# Patient Record
Sex: Female | Born: 1970 | Race: White | Hispanic: No | Marital: Married | Smoking: Current every day smoker
Health system: Southern US, Community
[De-identification: ages and names within clinical notes are randomized; demographics above are authoritative.]

## PROBLEM LIST (undated history)

## (undated) DIAGNOSIS — F419 Anxiety disorder, unspecified: Secondary | ICD-10-CM

## (undated) DIAGNOSIS — M549 Dorsalgia, unspecified: Secondary | ICD-10-CM

## (undated) DIAGNOSIS — E119 Type 2 diabetes mellitus without complications: Secondary | ICD-10-CM

## (undated) DIAGNOSIS — M1990 Unspecified osteoarthritis, unspecified site: Secondary | ICD-10-CM

---

## 1998-06-02 ENCOUNTER — Other Ambulatory Visit: Admission: RE | Admit: 1998-06-02 | Discharge: 1998-06-02 | Payer: Self-pay | Admitting: *Deleted

## 1998-07-08 ENCOUNTER — Emergency Department (HOSPITAL_COMMUNITY): Admission: EM | Admit: 1998-07-08 | Discharge: 1998-07-08 | Payer: Self-pay | Admitting: Emergency Medicine

## 2011-04-24 ENCOUNTER — Ambulatory Visit (HOSPITAL_COMMUNITY)
Admission: RE | Admit: 2011-04-24 | Discharge: 2011-04-24 | Disposition: A | Discharge status: Home / Self Care | Payer: Medicaid Other | Source: Ambulatory Visit | Attending: Oral Surgery | Admitting: Oral Surgery

## 2011-04-24 DIAGNOSIS — E119 Type 2 diabetes mellitus without complications: Secondary | ICD-10-CM | POA: Insufficient documentation

## 2011-04-24 DIAGNOSIS — K029 Dental caries, unspecified: Secondary | ICD-10-CM | POA: Insufficient documentation

## 2011-04-24 DIAGNOSIS — Z86011 Personal history of benign neoplasm of the brain: Secondary | ICD-10-CM | POA: Insufficient documentation

## 2011-04-24 LAB — BASIC METABOLIC PANEL
CO2: 28 mEq/L (ref 19–32)
Calcium: 9.3 mg/dL (ref 8.4–10.5)
Chloride: 100 mEq/L (ref 96–112)
Creatinine, Ser: 0.85 mg/dL (ref 0.4–1.2)
GFR calc Af Amer: >60 mL/min (ref >60)
Sodium: 136 mEq/L (ref 135–145)

## 2011-04-24 LAB — CBC
Hemoglobin: 12.8 g/dL (ref 12.0–15.0)
MCH: 29.4 pg (ref 26.0–34.0)
MCHC: 32.9 g/dL (ref 30.0–36.0)
Platelets: 320 10*3/uL (ref 150–400)
RBC: 4.36 MIL/uL (ref 3.87–5.11)

## 2011-04-24 LAB — GLUCOSE, CAPILLARY
Glucose-Capillary: 111 mg/dL — ABNORMAL HIGH (ref 70–99)
Glucose-Capillary: 164 mg/dL — ABNORMAL HIGH (ref 70–99)

## 2011-04-24 LAB — HCG, SERUM, QUALITATIVE: Preg, Serum: NEGATIVE

## 2011-04-30 NOTE — Op Note (Signed)
Carrie Carlson, Carrie Carlson               ACCOUNT NO.:  [id number]  MEDICAL RECORD NO.:  [id number]           PATIENT TYPE:  O  LOCATION:  SDSC                         FACILITY:  [facility]  PHYSICIAN:  Georgia Lopes, M.D.  DATE OF BIRTH:  [birth year]  DATE OF PROCEDURE:  04/24/2011 DATE OF DISCHARGE:  04/24/2011                              OPERATIVE REPORT   POSTOPERATIVE DIAGNOSIS:  Nonrestorable teeth numbers 4, 5, 6, 9, 10, 11, 12, 13, 14, 15, 18, 19, 20, 28, 29, 30, and 31.  PROCEDURE:  Removal of 4, 5, 6, 9, 10, 11, 12, 13, 14, 15, 18, 19, 20, 28, 29, 30, and 31, alveoplasty maxilla-mandible.  SURGEON:  Georgia Lopes, MD  ANESTHESIA:  General, oral, Dr. Jacklynn Bue attending.  ASSISTANTS: 1. Luberta Mutter, DOMA 2. Marlana Latus, DOMA  INDICATIONS FOR PROCEDURE:  Carrie Carlson is a 40 year old female who was referred to my office by her general dentist for removal of all remaining teeth.  She has past medical history of diabetes, history of narcotic drug abuse, and history of pituitary of brain tumor.  Because of the extensiveness of the procedure and medical comorbidities. it was elected to perform this surgery with the patient intubated for airway safety in the hospital setting as an outpatient.  PROCEDURE:  The patient was taken to the operating room and placed on the table in supine position.  General anesthesia was administered. Attempts were made x2 at nasal intubation and were aborted after not being able to intubate by the CRNA and oral tube was then placed atraumatically and the patient was then draped for the procedure after the eyes were protected.  The posterior pharynx was suctioned.  A throat pack was placed.  lidocaine 2% with 1:100,000 epinephrine was infiltrated in an inferior alveolar block on the right and left side and palatal buccal and palatal infiltration, a total of 12 mL was utilized. A #15 blade was used to make a full-thickness incision around teeth numbers  18, 19, 20 and numbers 19, 11, 12, 13, 14, and 15.  The periosteum was reflected around these teeth.  Interproximal bone was removed with a Stryker handpiece, crosscut fissure bur under irrigation. Teeth were elevated with a 301 elevator.  The lower teeth were removed with the cowhorn forceps and the Asch forceps and the maxillary teeth were removed with the upper universal #150 forceps.  Teeth numbers 18 and 19 fractured and required further sectioning and removal of the roots with 301 elevator and rongeurs and the maxilla teeth numbers 12, 14, and 15 required further sectioning for removal of root tips.  The periosteum was then further reflected in the maxilla and mandible and alveoplasty was performed with the egg-shaped bur and further smoothed with a bone file.  Then, the areas were irrigated and closed with 3-0 chromic.  Attention was turned to the right side of the mouth.  A #15 blade was used to make a full-thickness incision around teeth numbers 4, 5 6, 28, 29, 30, and 31.  The periosteum was reflected.  Bone was removed proximally with a Stryker handpiece and then the teeth were elevated  with a 301 elevator.  The upper teeth were removed with the #150 forceps and the lower teeth were removed with the cowhorn and Ash forceps and the sockets were curetted.  The periosteum was further reflected and alveoplasty was performed with the egg-shaped bur and the bone file.  Then, the areas were irrigated and closed with 3-0 chromic. The oral cavity was inspected and found to have good continuity and hemostasis.  The posterior pharynx was suctioned.  Throat pack was removed.  The patient was awakened and taken to the recovery room breathing spontaneously in good condition.  ESTIMATED BLOOD LOSS:  Minimum.  COMPLICATIONS:  None.  SPECIMENS:  None.     Georgia Lopes, M.D.     SMJ/MEDQ  D:  04/24/2011  T:  04/25/2011  Job:  [id number]  Electronically Signed by Ocie Doyne M.D.  on 04/30/2011 10:24:25 AM

## 2015-04-25 ENCOUNTER — Emergency Department (HOSPITAL_COMMUNITY): Payer: Medicaid Other

## 2015-04-25 ENCOUNTER — Emergency Department (HOSPITAL_COMMUNITY)
Admission: EM | Admit: 2015-04-25 | Discharge: 2015-04-27 | Disposition: A | Discharge status: Home / Self Care | Payer: Medicaid Other | Attending: Emergency Medicine | Admitting: Emergency Medicine

## 2015-04-25 ENCOUNTER — Encounter (HOSPITAL_COMMUNITY): Payer: Self-pay | Admitting: Emergency Medicine

## 2015-04-25 DIAGNOSIS — Z23 Encounter for immunization: Secondary | ICD-10-CM | POA: Diagnosis not present

## 2015-04-25 DIAGNOSIS — Y998 Other external cause status: Secondary | ICD-10-CM | POA: Insufficient documentation

## 2015-04-25 DIAGNOSIS — Z8739 Personal history of other diseases of the musculoskeletal system and connective tissue: Secondary | ICD-10-CM | POA: Diagnosis not present

## 2015-04-25 DIAGNOSIS — F1110 Opioid abuse, uncomplicated: Secondary | ICD-10-CM | POA: Insufficient documentation

## 2015-04-25 DIAGNOSIS — R059 Cough, unspecified: Secondary | ICD-10-CM

## 2015-04-25 DIAGNOSIS — R05 Cough: Secondary | ICD-10-CM | POA: Diagnosis not present

## 2015-04-25 DIAGNOSIS — Y9389 Activity, other specified: Secondary | ICD-10-CM | POA: Diagnosis not present

## 2015-04-25 DIAGNOSIS — X780XXA Intentional self-harm by sharp glass, initial encounter: Secondary | ICD-10-CM | POA: Insufficient documentation

## 2015-04-25 DIAGNOSIS — F1310 Sedative, hypnotic or anxiolytic abuse, uncomplicated: Secondary | ICD-10-CM | POA: Diagnosis not present

## 2015-04-25 DIAGNOSIS — Z720 Tobacco use: Secondary | ICD-10-CM | POA: Diagnosis not present

## 2015-04-25 DIAGNOSIS — S61511A Laceration without foreign body of right wrist, initial encounter: Secondary | ICD-10-CM | POA: Diagnosis not present

## 2015-04-25 DIAGNOSIS — F250 Schizoaffective disorder, bipolar type: Secondary | ICD-10-CM | POA: Diagnosis present

## 2015-04-25 DIAGNOSIS — Y9289 Other specified places as the place of occurrence of the external cause: Secondary | ICD-10-CM | POA: Insufficient documentation

## 2015-04-25 DIAGNOSIS — F4324 Adjustment disorder with disturbance of conduct: Secondary | ICD-10-CM | POA: Diagnosis present

## 2015-04-25 DIAGNOSIS — Z046 Encounter for general psychiatric examination, requested by authority: Secondary | ICD-10-CM | POA: Diagnosis present

## 2015-04-25 HISTORY — DX: Anxiety disorder, unspecified: F41.9

## 2015-04-25 HISTORY — DX: Unspecified osteoarthritis, unspecified site: M19.90

## 2015-04-25 HISTORY — DX: Dorsalgia, unspecified: M54.9

## 2015-04-25 LAB — CBC
HCT: 44.5 % (ref 36.0–46.0)
HEMOGLOBIN: 15 g/dL (ref 12.0–15.0)
MCH: 29.5 pg (ref 26.0–34.0)
MCHC: 33.7 g/dL (ref 30.0–36.0)
MCV: 87.6 fL (ref 78.0–100.0)
PLATELETS: 372 10*3/uL (ref 150–400)
RBC: 5.08 MIL/uL (ref 3.87–5.11)
RDW: 14.5 % (ref 11.5–15.5)
WBC: 17.2 10*3/uL — AB (ref 4.0–10.5)

## 2015-04-25 LAB — RAPID URINE DRUG SCREEN, HOSP PERFORMED
Amphetamines: NOT DETECTED
BARBITURATES: NOT DETECTED
Benzodiazepines: POSITIVE — AB
COCAINE: NOT DETECTED
Opiates: POSITIVE — AB
TETRAHYDROCANNABINOL: NOT DETECTED

## 2015-04-25 LAB — COMPREHENSIVE METABOLIC PANEL
ALBUMIN: 4.4 g/dL (ref 3.5–5.2)
ALK PHOS: 106 U/L (ref 39–117)
ALT: 41 U/L — ABNORMAL HIGH (ref 0–35)
ANION GAP: 10 (ref 5–15)
AST: 38 U/L — AB (ref 0–37)
BILIRUBIN TOTAL: 0.4 mg/dL (ref 0.3–1.2)
BUN: 10 mg/dL (ref 6–23)
CO2: 26 mmol/L (ref 19–32)
CREATININE: 0.68 mg/dL (ref 0.50–1.10)
Calcium: 9.4 mg/dL (ref 8.4–10.5)
Chloride: 101 mmol/L (ref 96–112)
Glucose, Bld: 246 mg/dL — ABNORMAL HIGH (ref 70–99)
Potassium: 4 mmol/L (ref 3.5–5.1)
Sodium: 137 mmol/L (ref 135–145)
TOTAL PROTEIN: 8 g/dL (ref 6.0–8.3)

## 2015-04-25 LAB — SALICYLATE LEVEL: Salicylate Lvl: <4 mg/dL (ref 2.8–20.0)

## 2015-04-25 LAB — ACETAMINOPHEN LEVEL: Acetaminophen (Tylenol), Serum: <10 ug/mL — ABNORMAL LOW (ref 10–30)

## 2015-04-25 LAB — ETHANOL: Alcohol, Ethyl (B): <5 mg/dL (ref 0–9)

## 2015-04-25 MED ORDER — HYDROCODONE-ACETAMINOPHEN 5-325 MG PO TABS
2.0000 | ORAL_TABLET | Freq: Once | ORAL | Status: DC
  Filled 2015-04-25: qty 2

## 2015-04-25 MED ORDER — LORAZEPAM 1 MG PO TABS
1.0000 mg | ORAL_TABLET | Freq: Once | ORAL | Status: DC
  Filled 2015-04-25: qty 1

## 2015-04-25 MED ORDER — TETANUS-DIPHTH-ACELL PERTUSSIS 5-2.5-18.5 LF-MCG/0.5 IM SUSP
0.5000 mL | Freq: Once | INTRAMUSCULAR | Status: AC
  Administered 2015-04-25: 0.5 mL via INTRAMUSCULAR
  Filled 2015-04-25: qty 0.5

## 2015-04-25 NOTE — ED Notes (Signed)
Per pt, lives [facility]-states they took her air conditioner because they said she was using too much electricity-states she cut right wrist on air conditioner-states she was sleeping and next thing she knows they have taken papers out on her stating she cut her wrist on purpose

## 2015-04-25 NOTE — ED Notes (Signed)
Bed: [facility] Expected date:  Expected time:  Means of arrival:  Comments: No bed  

## 2015-04-25 NOTE — BH Assessment (Addendum)
Tele Assessment Note   Carrie Carlson is an 44 y.o. female.  -Clinician spoke to [facility], the PA about need for TTS.  Pt has IVC papers taken out by the facility director at [facility].  Patient said that she had gotten upset with the maintenance people at [facility].  Her husband had put in a small air conditioner unit in her window the week before last.  The maintenance people removed it because they said it was causing the breaker to be tripped.  She got upset with him and called them a name or two.  Patient got mad and hit her hand on the window and broke the glass and cut her right wrist.  The police were called and patient was given the opportunity to have EMS come out and she asked for EMS.  The paramedics looked at her hand and offered to take her to the ED which she turned down.  Patient took an ativan to help calm her around 16:00.  She went to sleep.  Around 17:50 she was awoken by the police who were there to serve papers.  Patient currently denies any SI, HI or A/V hallucinations.  Patient says that she is seen by the psychiatrist that does rounds there.  She did not know the person's name.  Patient admits to [name] in [facility] past.  [facility] has [facility] for about a 6 month period.  Pt says that she can perform her ADLs but that she needs help with reminders about cleaning.  Clinician called [facility] to talk with Carrie Carlson (facility director) who took out the IVC papers.  Clinician spoke to [place] (2nd shift) who had heard 2nd hand accounts of what happened.  She said that the director would be in during the day tomorrow (04/26) and could provide more information on what happened.  Carrie PacificKenisha said that their psychiatrist is Dr. Cherene Julianornelia Carlson.  -Clinician did talk to [name], PA regarding disposition.  He said that if patient is unable to perform her ADLs completely then she would not be able [facility].  He said that an AM  psychiatric evaluation on 04/26 to uphold or rescind IVC is needed.  Clinician informed Trisha MangleKaren Sophia about the disposition.   Axis I: Post Traumatic Stress Disorder Axis II: Deferred Axis III:  Past Medical History  Diagnosis Date  . Osteoarthritis   . Back pain   . Anxiety    Axis IV: other psychosocial or environmental problems and problems with primary support group Axis V: 41-50 serious symptoms  Past Medical History:  Past Medical History  Diagnosis Date  . Osteoarthritis   . Back pain   . Anxiety     History reviewed. No pertinent past surgical history.  Family History: No family history on file.  Social History:  reports that she has been smoking.  She does not have any smokeless tobacco history on file. She reports that she does not drink alcohol or use illicit drugs.  Additional Social History:  Alcohol / Drug Use Pain Medications: See PTA medication list Prescriptions: See PTA medication list Over the Counter: See PTA medication list History of alcohol / drug use?: No history of alcohol / drug abuse  CIWA: CIWA-Ar BP: 109/69 mmHg Pulse Rate: 100 COWS:    PATIENT STRENGTHS: (choose at least two) Average or above average intelligence Communication skills  Allergies: No Known Allergies  Home Medications:  (Not in a  hospital admission)  OB/GYN Status:  No LMP recorded. Patient is postmenopausal.  General Assessment Data Location of Assessment: [facility] ED Is this a Tele or Face-to-Face Assessment?: Face-to-Face Is this an Initial Assessment or a Re-assessment for this encounter?: Initial Assessment Living Arrangements: Other (Comment) ([facility] for the last 6 months.) Can pt return to current living arrangement?: Yes Admission Status: Involuntary Is patient capable of signing voluntary admission?: No Transfer from: Acute Hospital Referral Source: Other ([facility] staff)     [facility] Crisis Care Plan Living Arrangements: Other (Comment) ([facility] for the  last 6 months.) Name of Psychiatrist: Psychiatrist w/ [facility] Name of Therapist: None  Education Status Highest grade of school patient has completed: 10th grade  Risk to self with the past 6 months Suicidal Ideation: No Suicidal Intent: No Is patient at risk for suicide?: No Suicidal Plan?: No Access to Means: No What has been your use of drugs/alcohol within the last 12 months?: None Previous Attempts/Gestures: No How many times?: 0 Other Self Harm Risks: none Triggers for Past Attempts: None known Intentional Self Injurious Behavior: None Family Suicide History: Yes (An uncle committed suicide.) Recent stressful life event(s): Conflict (Comment) Research scientist (medical) removed from room at [name] today) Persecutory voices/beliefs?: No Depression: Yes Depression Symptoms: Despondent, Isolating Substance abuse history and/or treatment for substance abuse?: No Suicide prevention information given to non-admitted patients: Not applicable  Risk to Others within the past 6 months Homicidal Ideation: No Thoughts of Harm to Others: No Current Homicidal Intent: No Current Homicidal Plan: No Access to Homicidal Means: No Identified Victim: No one History of harm to others?: No Assessment of Violence: None Noted Violent Behavior Description: None reported. Does patient have access to weapons?: No Criminal Charges Pending?: No Does patient have a court date: No  Psychosis Hallucinations: None noted Delusions: None noted  Mental Status Report Appearance/Hygiene: Disheveled, In scrubs Eye Contact: Poor Motor Activity: Freedom of movement, Unremarkable Speech: Logical/coherent Level of Consciousness: Quiet/awake Mood: Depressed, Sad Affect: Apprehensive, Depressed Anxiety Level: Severe Thought Processes: Coherent, Relevant Judgement: Unimpaired Orientation: Person, Place, Time, Situation Obsessive Compulsive Thoughts/Behaviors: None  Cognitive  Functioning Concentration: Decreased Memory: Remote Intact, Recent Intact IQ: Average Insight: Fair Impulse Control: Poor Appetite: Good Weight Loss: 0 Weight Gain: 0 Sleep: Decreased Total Hours of Sleep:  (<6H/D) Vegetative Symptoms: None  ADLScreening [facility] Assessment Services) Patient's cognitive ability adequate to safely complete daily activities?: Yes Patient able to express need for assistance with ADLs?: Yes Independently performs ADLs?: Yes (appropriate for developmental age) (Needs help with some housekeeping items.)  Prior Inpatient Therapy Prior Inpatient Therapy: Yes Prior Therapy Dates: "I don't remember." Prior Therapy Facilty/Provider([facility]): [facility]  Reason for Treatment: stabilization  Prior Outpatient Therapy Prior Outpatient Therapy: Yes Prior Therapy Dates: Last 6 months Prior Therapy Facilty/Provider(s): Psychiatrist on contract w/ [facility] Reason for Treatment: med monitoring  ADL Screening (condition at time of admission) Patient's cognitive ability adequate to safely complete daily activities?: Yes Is the patient deaf or have difficulty hearing?: No Does the patient have difficulty seeing, even when wearing glasses/contacts?: No Does the patient have difficulty concentrating, remembering, or making decisions?: Yes Patient able to express need for assistance with ADLs?: Yes Does the patient have difficulty dressing or bathing?: No Independently performs ADLs?: Yes (appropriate for developmental age) (Needs help with some housekeeping items.) Does the patient have difficulty walking or climbing stairs?: Yes (Takes one step at a time and uses rails.) Weakness of Legs: Both (Both knees are  weak.) Weakness of Arms/Hands: None  Home Assistive Devices/Equipment Home Assistive Devices/Equipment: None    Abuse/Neglect Assessment (Assessment to be complete while patient is alone) Physical Abuse: Denies Verbal Abuse: Denies Sexual Abuse:  Denies Exploitation of patient/patient's resources: Denies Self-Neglect: Denies     Merchant navy officer (For Healthcare) Does patient have an advance directive?: No Would patient like information on creating an advanced directive?: No - patient declined information    Additional Information 1:1 In Past 12 Months?: No CIRT Risk: No Elopement Risk: No Does patient have medical clearance?: Yes     Disposition:  Disposition Initial Assessment Completed for this Encounter: Yes Disposition of Patient: Other dispositions Other disposition(s): To current provider (To be reviewed with PA)  Beatriz Stallion Ray 04/25/2015 9:49 PM

## 2015-04-25 NOTE — ED Provider Notes (Signed)
CSN: [id number]     [id number] date & time 04/25/15  1813 History   First MD Initiated Contact with Patient 04/25/15 1920     Chief Complaint  Patient presents with  . IVC      (Consider location/radiation/quality/duration/timing/severity/associated sxs/prior Treatment) Patient is a 44 y.o. female presenting with mental health disorder. The history is provided by the patient. No language interpreter was used.  Mental Health Problem Presenting symptoms: aggressive behavior and self mutilation   Presenting symptoms: no disorganized speech and no disorganized thought process   Patient accompanied by:  Law enforcement Degree of incapacity (severity):  Moderate Onset quality:  Sudden Duration:  1 day Progression:  Worsening Context: recent medication change   Treatment compliance:  All of the time Relieved by:  Anti-anxiety medications Worsened by:  Nothing tried Ineffective treatments:  None tried Associated symptoms: poor judgment   Pt lives in an assisted living.  Pt reports they took out her air condition and she became mad and hit the glass window and broke it.   Pt has a cut on her wrist.   The facility did IVC papers due to increasing aggression.  [facility] because she can not care for herself.   Past Medical History  Diagnosis Date  . Osteoarthritis   . Back pain   . Anxiety    History reviewed. No pertinent past surgical history. No family history on file. History  Substance Use Topics  . Smoking status: Current Every Day Smoker  . Smokeless tobacco: Not on file  . Alcohol Use: No   OB History    No data available     Review of Systems  Psychiatric/Behavioral: Positive for self-injury.  All other systems reviewed and are negative.     Allergies  Review of patient's allergies indicates not on file.  Home Medications   Prior to Admission medications   Not on File   BP 163/115 mmHg  Pulse 141  Temp(Src) 98.4 F (36.9 C)  (Oral)  Resp 18  SpO2 94% Physical Exam  Constitutional: She is oriented to person, place, and time. She appears well-developed and well-nourished.  HENT:  Head: Normocephalic and atraumatic.  Right Ear: External ear normal.  Left Ear: External ear normal.  Mouth/Throat: Oropharynx is clear and moist.  Eyes: Conjunctivae and EOM are normal. Pupils are equal, round, and reactive to light.  Neck: Normal range of motion. Neck supple.  Cardiovascular: Normal rate and normal heart sounds.   Pulmonary/Chest: Effort normal and breath sounds normal.  Abdominal: Soft.  Musculoskeletal:  1.4 cm laceration right wrist.   No gapping,  From,  nv and ns intact  Neurological: She is alert and oriented to person, place, and time. She has normal reflexes.  Skin: Skin is warm.  Psychiatric: She has a normal mood and affect.  Nursing note and vitals reviewed.   ED Course  Procedures (including critical care time) Labs Review Labs Reviewed  ACETAMINOPHEN LEVEL  CBC  COMPREHENSIVE METABOLIC PANEL  ETHANOL  SALICYLATE LEVEL  URINE RAPID DRUG SCREEN (HOSP PERFORMED)   Results for orders placed or performed during the hospital encounter of 04/25/15  Acetaminophen level  Result Value Ref Range   Acetaminophen (Tylenol), Serum <10.0 (L) 10 - 30 ug/mL  CBC  Result Value Ref Range   WBC 17.2 (H) 4.0 - 10.5 K/uL   RBC 5.08 3.87 - 5.11 MIL/uL   Hemoglobin 15.0 12.0 - 15.0 g/dL   HCT 40.944.5 81.136.0 -  46.0 %   MCV 87.6 78.0 - 100.0 fL   MCH 29.5 26.0 - 34.0 pg   MCHC 33.7 30.0 - 36.0 g/dL   RDW 16.1 09.6 - 04.5 %   Platelets 372 150 - 400 K/uL  Comprehensive metabolic panel  Result Value Ref Range   Sodium 137 135 - 145 mmol/L   Potassium 4.0 3.5 - 5.1 mmol/L   Chloride 101 96 - 112 mmol/L   CO2 26 19 - 32 mmol/L   Glucose, Bld 246 (H) 70 - 99 mg/dL   BUN 10 6 - 23 mg/dL   Creatinine, Ser 4.09 0.50 - 1.10 mg/dL   Calcium 9.4 8.4 - 81.1 mg/dL   Total Protein 8.0 6.0 - 8.3 g/dL   Albumin 4.4 3.5  - 5.2 g/dL   AST 38 (H) 0 - 37 U/L   ALT 41 (H) 0 - 35 U/L   Alkaline Phosphatase 106 39 - 117 U/L   Total Bilirubin 0.4 0.3 - 1.2 mg/dL   GFR calc non Af Amer >90 >90 mL/min   GFR calc Af Amer >90 >90 mL/min   Anion gap 10 5 - 15  Ethanol (ETOH)  Result Value Ref Range   Alcohol, Ethyl (B) <5 0 - 9 mg/dL  Salicylate level  Result Value Ref Range   Salicylate Lvl <4.0 2.8 - 20.0 mg/dL   Dg Chest 2 View  09/13/7828   CLINICAL DATA:  Patient complaining of dry cough and shortness of breath for 2 weeks.  EXAM: CHEST  2 VIEW  COMPARISON:  None.  FINDINGS: Normal heart, mediastinum and hila.  Lungs are clear.  No pleural effusion or pneumothorax.  Bony thorax is intact.  IMPRESSION: No active cardiopulmonary disease.   Electronically Signed   By: Amie Portland M.D.   On: 04/25/2015 20:50    Imaging Review Dg Chest 2 View  04/25/2015   CLINICAL DATA:  Patient complaining of dry cough and shortness of breath for 2 weeks.  EXAM: CHEST  2 VIEW  COMPARISON:  None.  FINDINGS: Normal heart, mediastinum and hila.  Lungs are clear.  No pleural effusion or pneumothorax.  Bony thorax is intact.  IMPRESSION: No active cardiopulmonary disease.   Electronically Signed   By: Amie Portland M.D.   On: 04/25/2015 20:50     EKG Interpretation None      MDM  TTs assessed pt and advised that Psychiatrist needs to see Pt in am.     Final diagnoses:  Cough  Laceration of right wrist, initial encounter  Adjustment disorder with disturbance of conduct       Elson Areas, PA-C 04/26/15 1515  Pricilla Loveless, MD 04/27/15 0700

## 2015-04-26 DIAGNOSIS — S61511A Laceration without foreign body of right wrist, initial encounter: Secondary | ICD-10-CM | POA: Insufficient documentation

## 2015-04-26 DIAGNOSIS — F4324 Adjustment disorder with disturbance of conduct: Secondary | ICD-10-CM | POA: Diagnosis present

## 2015-04-26 DIAGNOSIS — F250 Schizoaffective disorder, bipolar type: Secondary | ICD-10-CM | POA: Diagnosis not present

## 2015-04-26 MED ORDER — LORAZEPAM 1 MG PO TABS
1.0000 mg | ORAL_TABLET | Freq: Three times a day (TID) | ORAL | Status: DC | PRN
  Administered 2015-04-26 – 2015-04-27 (×3): 1 mg via ORAL
  Filled 2015-04-26 (×3): qty 1

## 2015-04-26 MED ORDER — METFORMIN HCL 500 MG PO TABS
500.0000 mg | ORAL_TABLET | Freq: Every day | ORAL | Status: DC
  Administered 2015-04-26: 500 mg via ORAL
  Filled 2015-04-26 (×2): qty 1

## 2015-04-26 MED ORDER — FLUPHENAZINE HCL 2.5 MG PO TABS
2.5000 mg | ORAL_TABLET | Freq: Every day | ORAL | Status: DC
  Administered 2015-04-26 – 2015-04-27 (×2): 2.5 mg via ORAL
  Filled 2015-04-26 (×2): qty 1

## 2015-04-26 MED ORDER — METFORMIN HCL 500 MG PO TABS
1000.0000 mg | ORAL_TABLET | Freq: Every day | ORAL | Status: DC
  Administered 2015-04-26 – 2015-04-27 (×2): 1000 mg via ORAL
  Filled 2015-04-26 (×3): qty 2

## 2015-04-26 MED ORDER — BENZTROPINE MESYLATE 1 MG PO TABS
1.0000 mg | ORAL_TABLET | Freq: Two times a day (BID) | ORAL | Status: DC
  Administered 2015-04-26 – 2015-04-27 (×3): 1 mg via ORAL
  Filled 2015-04-26 (×3): qty 1

## 2015-04-26 MED ORDER — IBUPROFEN 200 MG PO TABS
600.0000 mg | ORAL_TABLET | Freq: Once | ORAL | Status: AC
  Administered 2015-04-26: 600 mg via ORAL
  Filled 2015-04-26: qty 3

## 2015-04-26 MED ORDER — HYDROCODONE-ACETAMINOPHEN 5-325 MG PO TABS
1.0000 | ORAL_TABLET | Freq: Three times a day (TID) | ORAL | Status: DC
  Administered 2015-04-26: 1 via ORAL
  Filled 2015-04-26: qty 1

## 2015-04-26 MED ORDER — ESCITALOPRAM OXALATE 10 MG PO TABS
10.0000 mg | ORAL_TABLET | Freq: Every day | ORAL | Status: DC
  Administered 2015-04-26 – 2015-04-27 (×2): 10 mg via ORAL
  Filled 2015-04-26 (×2): qty 1

## 2015-04-26 MED ORDER — PANTOPRAZOLE SODIUM 40 MG PO TBEC
40.0000 mg | DELAYED_RELEASE_TABLET | Freq: Two times a day (BID) | ORAL | Status: DC
  Administered 2015-04-26 – 2015-04-27 (×3): 40 mg via ORAL
  Filled 2015-04-26 (×3): qty 1

## 2015-04-26 MED ORDER — POTASSIUM CHLORIDE CRYS ER 10 MEQ PO TBCR
10.0000 meq | EXTENDED_RELEASE_TABLET | Freq: Every day | ORAL | Status: DC
  Administered 2015-04-26 – 2015-04-27 (×2): 10 meq via ORAL
  Filled 2015-04-26 (×2): qty 1

## 2015-04-26 MED ORDER — ATORVASTATIN CALCIUM 80 MG PO TABS
80.0000 mg | ORAL_TABLET | Freq: Every day | ORAL | Status: DC
  Administered 2015-04-26: 80 mg via ORAL
  Filled 2015-04-26 (×2): qty 1

## 2015-04-26 MED ORDER — MIRTAZAPINE 30 MG PO TABS
30.0000 mg | ORAL_TABLET | Freq: Every day | ORAL | Status: DC
  Administered 2015-04-26: 30 mg via ORAL
  Filled 2015-04-26: qty 1

## 2015-04-26 MED ORDER — LEVOTHYROXINE SODIUM 25 MCG PO TABS
25.0000 ug | ORAL_TABLET | Freq: Every day | ORAL | Status: DC
  Administered 2015-04-26 – 2015-04-27 (×2): 25 ug via ORAL
  Filled 2015-04-26 (×3): qty 1

## 2015-04-26 MED ORDER — HYDROCHLOROTHIAZIDE 12.5 MG PO CAPS
25.0000 mg | ORAL_CAPSULE | Freq: Every day | ORAL | Status: DC
Start: 2015-04-26 — End: 2015-04-27
  Administered 2015-04-26 – 2015-04-27 (×2): 25 mg via ORAL
  Filled 2015-04-26 (×2): qty 2

## 2015-04-26 MED ORDER — PERPHENAZINE 8 MG PO TABS
8.0000 mg | ORAL_TABLET | Freq: Two times a day (BID) | ORAL | Status: DC
  Administered 2015-04-26 – 2015-04-27 (×3): 8 mg via ORAL
  Filled 2015-04-26 (×4): qty 1

## 2015-04-26 MED ORDER — ONDANSETRON HCL 4 MG PO TABS: 4.0000 mg | ORAL_TABLET | Freq: Four times a day (QID) | ORAL | Status: DC | PRN

## 2015-04-26 MED ORDER — GABAPENTIN 300 MG PO CAPS
300.0000 mg | ORAL_CAPSULE | Freq: Three times a day (TID) | ORAL | Status: DC
  Administered 2015-04-26 – 2015-04-27 (×4): 300 mg via ORAL
  Filled 2015-04-26 (×4): qty 1

## 2015-04-26 NOTE — ED Notes (Signed)
Psychiatrist at bedside

## 2015-04-26 NOTE — BH Assessment (Addendum)
[facility] Assessment Progress Note  The following facilities have been contacted to seek placement for this patient, with results as noted:  Beds available, information faxed, decision pending:  [place] [facility]  [facility]  [facility]  Unable to accept adult Medicaid:  [facility] [address] [facility] [facility]  Doylene Canninghomas Jaidev [place], [place] Triage Specialist [phone]

## 2015-04-26 NOTE — Progress Notes (Signed)
CSW spoke with [facility] from [facility] who stated that patient had an ac unit in her room provided by her husband, and they had gone to rewire the ac as the husband had installed the unit incorrectly. Per facility when explaining to patient she went off and puched out a window. Per facility patient has been refusing medications for the past month, talking to herself and pacing. Patient has a guardian, Wallie Charmelia Patel through [place] [phone]. Per Allena KatzPatel, patient has been enabled by her husband by bringing lots of unnecessary items causing safety hazards in room, not taking medications, and being triggered by interactions with husband when not able to do something she wants to do. This included going to the beach, patient has not been given permission to leave facility for a weekend trip. Per guardian, patient needs to get back on medication. Pt guardian voiced that she would liek patient to go to another facility once stable. CSW informed guardian that unless pt was in imminent danger, patient would return to current alf if stable before another facility or hosptial was found for pt placement. Pt guardian verblized understanding. CSW to follow up with guardian regarding further psychiatry evaluation tomorrow.  Pt recommended for inpatient treatment.   Olga CoasterKristen Marquize Seib, LCSW  Clinical Social Work  [facility] [phone]

## 2015-04-26 NOTE — ED Notes (Signed)
Pt inquiring about her pain medication that she normally takes at home. Dr. Freida BusmanAllen consulted about this.
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
## 2015-04-27 LAB — CBG MONITORING, ED: GLUCOSE-CAPILLARY: 225 mg/dL — AB (ref 70–99)

## 2015-04-27 MED ORDER — IBUPROFEN 200 MG PO TABS
600.0000 mg | ORAL_TABLET | Freq: Four times a day (QID) | ORAL | Status: DC | PRN
  Administered 2015-04-27: 600 mg via ORAL
  Filled 2015-04-27: qty 3

## 2015-04-27 NOTE — ED Notes (Signed)
Psychiatry at bedside.
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
## 2015-04-27 NOTE — ED Notes (Signed)
Motrin order obtained from Nanine MeansLord Jamison with psychiatry.

## 2015-04-27 NOTE — Progress Notes (Signed)
Pt psychiatrically stable for discharge back to ALF per psychiatrist and NP. RN to call report to [phone].   Olga CoasterKristen Merary Garguilo, LCSW  Clinical Social Work  [facility] [phone]

## 2015-04-27 NOTE — ED Notes (Signed)
[facility]

## 2015-04-27 NOTE — ED Notes (Addendum)
Pt requesting her vicodin she reports she takes it every day for the last 2 years.

## 2015-04-27 NOTE — ED Notes (Signed)
CBG registered 225 on ED Glucometer.
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
## 2015-04-27 NOTE — ED Notes (Signed)
Breakfast provided.

## 2015-06-12 ENCOUNTER — Encounter (HOSPITAL_COMMUNITY): Payer: Self-pay | Admitting: *Deleted

## 2015-06-12 ENCOUNTER — Emergency Department (HOSPITAL_COMMUNITY)
Admission: EM | Admit: 2015-06-12 | Discharge: 2015-06-12 | Disposition: A | Discharge status: Home / Self Care | Payer: Medicaid Other | Attending: Emergency Medicine | Admitting: Emergency Medicine

## 2015-06-12 DIAGNOSIS — Z791 Long term (current) use of non-steroidal anti-inflammatories (NSAID): Secondary | ICD-10-CM | POA: Diagnosis not present

## 2015-06-12 DIAGNOSIS — M25562 Pain in left knee: Secondary | ICD-10-CM | POA: Diagnosis not present

## 2015-06-12 DIAGNOSIS — M1990 Unspecified osteoarthritis, unspecified site: Secondary | ICD-10-CM | POA: Diagnosis not present

## 2015-06-12 DIAGNOSIS — G8929 Other chronic pain: Secondary | ICD-10-CM | POA: Diagnosis not present

## 2015-06-12 DIAGNOSIS — Z720 Tobacco use: Secondary | ICD-10-CM | POA: Diagnosis not present

## 2015-06-12 DIAGNOSIS — F419 Anxiety disorder, unspecified: Secondary | ICD-10-CM | POA: Diagnosis not present

## 2015-06-12 DIAGNOSIS — Z79899 Other long term (current) drug therapy: Secondary | ICD-10-CM | POA: Diagnosis not present

## 2015-06-12 MED ORDER — HYDROMORPHONE HCL 2 MG/ML IJ SOLN
2.0000 mg | Freq: Once | INTRAMUSCULAR | Status: AC
  Administered 2015-06-12: 2 mg via INTRAMUSCULAR
  Filled 2015-06-12: qty 1

## 2015-06-12 MED ORDER — LIDOCAINE 5 % EX PTCH: 1.0000 | MEDICATED_PATCH | CUTANEOUS | Status: DC

## 2015-06-12 NOTE — ED Notes (Signed)
Per EMS- Patient is a resident of [facility]. Patient c/o increased left knee pain. Patient wanted to leave [facility] yesterday and wanted to take her Hydrocodone and morphine with her. When staff told her no she became angry. Today, the patient ambulated to the ambulance and ws in no acute distress.

## 2015-06-12 NOTE — Discharge Instructions (Signed)

## 2015-06-12 NOTE — ED Notes (Signed)
Spoke with dorothy blackwell at [facility] for transport back to facility, they arent able to provide transport but report was given

## 2015-06-12 NOTE — ED Notes (Signed)
Pt is being picked up by [facility]>  NO distress

## 2015-06-12 NOTE — ED Notes (Signed)
Pt here by ems from [facility] (assisted living)  Due to left knee pain.  Not associated with any trauma. Pt has chronic knee pain and would like pain medication.

## 2015-06-12 NOTE — ED Notes (Signed)
Call to [facility] to find out about transport home, [facility] cant send anyone and so Sharin Mons was called for transport home

## 2015-06-12 NOTE — ED Provider Notes (Signed)
CSN: [id number]     Arrival date & time 06/12/15  1422 History  This chart was scribed for non-physician practitioner, Arthor Captain PA,  working with Blake Divine, MD, by Tanda Rockers, ED Scribe. This patient was seen in room WTR9/WTR9 and the patient's care was started at 3:22 PM.    Chief Complaint  Patient presents with  . Knee Pain   The history is provided by the patient. No language interpreter was used.     HPI Comments: Carrie Carlson is a 44 y.o. female brought in by ambulance, with hx chronic knee pain who presents to the Emergency Department complaining of worsening left knee pain x 1 week.  Pt is asking for a knee immobilizer at this time. She is currently taking 325 mg Hydrocodone without relief. Denies any recent injury, trauma, or fall. No weakness, numbness, redness, or any other associated symptoms.   Past Medical History  Diagnosis Date  . Osteoarthritis   . Back pain   . Anxiety    History reviewed. No pertinent past surgical history. No family history on file. History  Substance Use Topics  . Smoking status: Current Every Day Smoker  . Smokeless tobacco: Not on file  . Alcohol Use: No   OB History    No data available     Review of Systems  Constitutional: Negative for fever and chills.  HENT: Negative for congestion and rhinorrhea.   Respiratory: Negative for cough and shortness of breath.   Cardiovascular: Negative for chest pain.  Gastrointestinal: Negative for nausea and vomiting.  Musculoskeletal: Positive for arthralgias (Left knee pain. ).  Skin: Negative for color change.  Neurological: Negative for weakness and numbness.      Allergies  Review of patient's allergies indicates no known allergies.  Home Medications   Prior to Admission medications   Medication Sig Start Date End Date Taking? Authorizing Provider  acetaminophen (TYLENOL) 325 MG tablet Take 650 mg by mouth every 6 (six) hours as needed for moderate pain.    Historical  Provider, MD  atorvastatin (LIPITOR) 80 MG tablet Take 80 mg by mouth at bedtime.    Historical Provider, MD  benztropine (COGENTIN) 1 MG tablet Take 1 mg by mouth 2 (two) times daily.    Historical Provider, MD  cholecalciferol (VITAMIN D) 1000 UNITS tablet Take 1,000 Units by mouth daily.    Historical Provider, MD  escitalopram (LEXAPRO) 10 MG tablet Take 10 mg by mouth daily.    Historical Provider, MD  fluPHENAZine (PROLIXIN) 2.5 MG tablet Take 2.5 mg by mouth daily.    Historical Provider, MD  gabapentin (NEURONTIN) 300 MG capsule Take 300 mg by mouth 3 (three) times daily.    Historical Provider, MD  HYDROcodone-acetaminophen (NORCO/VICODIN) 5-325 MG per tablet Take 1 tablet by mouth 3 (three) times daily.    Historical Provider, MD  levothyroxine (SYNTHROID, LEVOTHROID) 25 MCG tablet Take 25 mcg by mouth daily before breakfast.    Historical Provider, MD  lidocaine (LIDODERM) 5 % Place 1 patch onto the skin daily. Remove & Discard patch within 12 hours or as directed by MD 06/12/15   Arthor Captain, PA-C  LORazepam (ATIVAN) 1 MG tablet Take 1 mg by mouth 3 (three) times daily as needed for anxiety (anxiety).    Historical Provider, MD  Melatonin 1 MG TABS Take 1 tablet by mouth at bedtime.    Historical Provider, MD  metFORMIN (GLUCOPHAGE) 1000 MG tablet Take 1,000 mg by mouth daily with breakfast.  Historical Provider, MD  metFORMIN (GLUCOPHAGE) 500 MG tablet Take 500 mg by mouth every evening.    Historical Provider, MD  mirtazapine (REMERON) 30 MG tablet Take 30 mg by mouth at bedtime.    Historical Provider, MD  ondansetron (ZOFRAN) 4 MG tablet Take 4 mg by mouth every 6 (six) hours as needed for nausea or vomiting.    Historical Provider, MD  pantoprazole (PROTONIX) 40 MG tablet Take 40 mg by mouth 2 (two) times daily.    Historical Provider, MD  perphenazine (TRILAFON) 8 MG tablet Take 8 mg by mouth 2 (two) times daily.    Historical Provider, MD   Triage Vitals: BP 142/89 mmHg   Pulse 103  Temp(Src) 97.7 F (36.5 C) (Oral)  Resp 20  Ht 5\' 7"  (1.702 m)  Wt 224 lb (101.606 kg)  BMI 35.08 kg/m2  SpO2 95%   Physical Exam  Constitutional: She is oriented to person, place, and time. She appears well-developed and well-nourished. No distress.  HENT:  Head: Normocephalic and atraumatic.  Eyes: Conjunctivae and EOM are normal.  Neck: Neck supple. No tracheal deviation present.  Cardiovascular: Normal rate.   Pulmonary/Chest: Effort normal. No respiratory distress.  Musculoskeletal: Normal range of motion.  Valgus and varus deformity to left knee  Neurological: She is alert and oriented to person, place, and time.  Skin: Skin is warm and dry.  Psychiatric: She has a normal mood and affect. Her behavior is normal.  Nursing note and vitals reviewed.   ED Course  Procedures (including critical care time)  DIAGNOSTIC STUDIES: Oxygen Saturation is 95% on RA, normal by my interpretation.    COORDINATION OF CARE: 3:25 PM-Discussed treatment plan which includes RX lidoderm cream, knee immobilizer, and referral to orthopedist with pt at bedside and pt agreed to plan.   Labs Review Labs Reviewed - No data to display  Imaging Review No results found.   EKG Interpretation None      MDM   Final diagnoses:  Chronic knee pain, left   Patient with acute on chronic knee pain . The patient will be given pain medication here in the ED. She requests a knee immobilizwe.  D/c w lidoderm paitches and f/u with ortho.  I personally performed the services described in this documentation, which was scribed in my presence. The recorded information has been reviewed and is accurate.     Arthor Captain, PA-C 06/12/15 1546  Blake Divine, MD 06/13/15 [phone]

## 2015-06-13 ENCOUNTER — Emergency Department (HOSPITAL_COMMUNITY)
Admission: EM | Admit: 2015-06-13 | Discharge: 2015-06-13 | Disposition: A | Discharge status: Home / Self Care | Payer: Medicaid Other | Attending: Emergency Medicine | Admitting: Emergency Medicine

## 2015-06-13 ENCOUNTER — Encounter (HOSPITAL_COMMUNITY): Payer: Self-pay | Admitting: Emergency Medicine

## 2015-06-13 DIAGNOSIS — M1990 Unspecified osteoarthritis, unspecified site: Secondary | ICD-10-CM | POA: Insufficient documentation

## 2015-06-13 DIAGNOSIS — G8929 Other chronic pain: Secondary | ICD-10-CM | POA: Insufficient documentation

## 2015-06-13 DIAGNOSIS — M25562 Pain in left knee: Secondary | ICD-10-CM | POA: Diagnosis not present

## 2015-06-13 DIAGNOSIS — F419 Anxiety disorder, unspecified: Secondary | ICD-10-CM | POA: Insufficient documentation

## 2015-06-13 DIAGNOSIS — Z720 Tobacco use: Secondary | ICD-10-CM | POA: Diagnosis not present

## 2015-06-13 DIAGNOSIS — Z79899 Other long term (current) drug therapy: Secondary | ICD-10-CM | POA: Diagnosis not present

## 2015-06-13 MED ORDER — HYDROCODONE-ACETAMINOPHEN 5-325 MG PO TABS
1.0000 | ORAL_TABLET | Freq: Once | ORAL | Status: AC
  Administered 2015-06-13: 1 via ORAL
  Filled 2015-06-13: qty 1

## 2015-06-13 NOTE — ED Provider Notes (Signed)
CSN: [id number]     Arrival date & time 06/13/15  1434 History  This chart was scribed for non-physician practitioner, Oswaldo Conroy, PA-C working with Lorre Nick, MD by Placido Sou, ED scribe. This patient was seen in room WTR7/WTR7 and the patient's care was started at 4:06 PM.   Chief Complaint  Patient presents with  . Knee Pain   The history is provided by the patient. No language interpreter was used.    HPI Comments: Carrie Carlson is a 44 y.o. female, with a history of osteoarthritis, who presents to the Emergency Department complaining of moderate, constant, chronic left knee pain with onset many months ago. She notes swelling to the affected area as an associated symptom. She notes currently taking a 325 mg Vicodin 3x per day for pain management with little relief of symptoms. She notes a history of x-rays and surgery to the affected knee. Following her left knee surgery she notes her physician diagnosed her with a bone spur. Pt denies making a recommended appointment with an orthopedist. She denies numbness, swelling, fever, chills, nausea, and vomiting.   Past Medical History  Diagnosis Date  . Osteoarthritis   . Back pain   . Anxiety    History reviewed. No pertinent past surgical history. No family history on file. History  Substance Use Topics  . Smoking status: Current Every Day Smoker  . Smokeless tobacco: Not on file  . Alcohol Use: No   OB History    No data available     Review of Systems  Constitutional: Negative for fever and chills.  Cardiovascular: Negative for chest pain and leg swelling.  Gastrointestinal: Negative for nausea and vomiting.  Musculoskeletal: Positive for myalgias and arthralgias.      Allergies  Review of patient's allergies indicates no known allergies.  Home Medications   Prior to Admission medications   Medication Sig Start Date End Date Taking? Authorizing Provider  acetaminophen (TYLENOL) 325 MG tablet Take 650 mg  by mouth every 6 (six) hours as needed for moderate pain.    Historical Provider, MD  atorvastatin (LIPITOR) 80 MG tablet Take 80 mg by mouth at bedtime.    Historical Provider, MD  benztropine (COGENTIN) 1 MG tablet Take 1 mg by mouth 2 (two) times daily.    Historical Provider, MD  cholecalciferol (VITAMIN D) 1000 UNITS tablet Take 1,000 Units by mouth daily.    Historical Provider, MD  escitalopram (LEXAPRO) 10 MG tablet Take 10 mg by mouth daily.    Historical Provider, MD  fluPHENAZine (PROLIXIN) 2.5 MG tablet Take 2.5 mg by mouth daily.    Historical Provider, MD  gabapentin (NEURONTIN) 300 MG capsule Take 300 mg by mouth 3 (three) times daily.    Historical Provider, MD  HYDROcodone-acetaminophen (NORCO/VICODIN) 5-325 MG per tablet Take 1 tablet by mouth 3 (three) times daily.    Historical Provider, MD  levothyroxine (SYNTHROID, LEVOTHROID) 25 MCG tablet Take 25 mcg by mouth daily before breakfast.    Historical Provider, MD  lidocaine (LIDODERM) 5 % Place 1 patch onto the skin daily. Remove & Discard patch within 12 hours or as directed by MD 06/12/15   Arthor Captain, PA-C  LORazepam (ATIVAN) 1 MG tablet Take 1 mg by mouth 3 (three) times daily as needed for anxiety (anxiety).    Historical Provider, MD  Melatonin 1 MG TABS Take 1 tablet by mouth at bedtime.    Historical Provider, MD  metFORMIN (GLUCOPHAGE) 1000 MG tablet Take 1,000 mg  by mouth daily with breakfast.    Historical Provider, MD  metFORMIN (GLUCOPHAGE) 500 MG tablet Take 500 mg by mouth every evening.    Historical Provider, MD  mirtazapine (REMERON) 30 MG tablet Take 30 mg by mouth at bedtime.    Historical Provider, MD  ondansetron (ZOFRAN) 4 MG tablet Take 4 mg by mouth every 6 (six) hours as needed for nausea or vomiting.    Historical Provider, MD  pantoprazole (PROTONIX) 40 MG tablet Take 40 mg by mouth 2 (two) times daily.    Historical Provider, MD  perphenazine (TRILAFON) 8 MG tablet Take 8 mg by mouth 2 (two) times  daily.    Historical Provider, MD   BP 138/83 mmHg  Pulse 100  Temp(Src) 98.2 F (36.8 C) (Oral)  Resp 18  SpO2 97% Physical Exam  Constitutional: She appears well-developed and well-nourished. No distress.  HENT:  Head: Normocephalic and atraumatic.  Eyes: Conjunctivae are normal. Right eye exhibits no discharge. Left eye exhibits no discharge.  Cardiovascular: Normal rate, regular rhythm and intact distal pulses.   2+ DP & PT pulses equal bilaterally  Pulmonary/Chest: Effort normal. No respiratory distress.  Musculoskeletal:  Mild crepitus with FROM. No knee effusion No calf tenderness No medial or lateral joint line tenderness Negative anterior posterior drawer test  No ligamentous laxity No wound, warmth or swelling  Neurological: She is alert. Coordination normal.  Skin: Skin is warm and dry. She is not diaphoretic.  Psychiatric: She has a normal mood and affect. Her behavior is normal.  Nursing note and vitals reviewed.   ED Course  Procedures  DIAGNOSTIC STUDIES: Oxygen Saturation is 98% on RA, normal by my interpretation.    COORDINATION OF CARE: 4:14 PM Discussed treatment plan with pt at bedside including a dose of hydrocodone and pt agreed to plan.  Labs Review Labs Reviewed - No data to display  Imaging Review No results found.   EKG Interpretation None      MDM   Final diagnoses:  Chronic pain of left knee   Patient presenting with chronic left knee pain without known injury. No neurological symptoms. No fevers. No warmth, swelling, redness. Full range of motion. I doubt septic arthritis. Patient ambulatory. Patient given narcotic in the ED discharge without a prescription due to chronic nature of knee pain. Patient was seen yesterday and given lidocaine patch patient states she has not had this filled yet. She was encouraged to do so as well as follow-up with orthopedics.  Discussed return precautions with patient. Discussed all results and  patient verbalizes understanding and agrees with plan.  I personally performed the services described in this documentation, which was scribed in my presence. The recorded information has been reviewed and is accurate.   Oswaldo Conroy, PA-C 06/13/15 1659  Lorre Nick, MD 06/13/15 [phone]

## 2015-06-13 NOTE — Discharge Instructions (Signed)
Return to the emergency room with worsening of symptoms, new symptoms or with symptoms that are concerning, especially fevers, redness, swelling, numbness, tingling, warmth, and unable to move left knee. RICE: Rest, Ice (three cycles of 20 mins on, off at least twice a day), compression/brace, elevation. Heating pad works well for back pain. Call to make appointment with orthopedics for further work up. Read below information and follow recommendations. Knee Pain The knee is the complex joint between your thigh and your lower leg. It is made up of bones, tendons, ligaments, and cartilage. The bones that make up the knee are:  The femur in the thigh.  The tibia and fibula in the lower leg.  The patella or kneecap riding in the groove on the lower femur. CAUSES  Knee pain is a common complaint with many causes. A few of these causes are:  Injury, such as:  A ruptured ligament or tendon injury.  Torn cartilage.  Medical conditions, such as:  Gout  Arthritis  Infections  Overuse, over training, or overdoing a physical activity. Knee pain can be minor or severe. Knee pain can accompany debilitating injury. Minor knee problems often respond well to self-care measures or get well on their own. More serious injuries may need medical intervention or even surgery. SYMPTOMS The knee is complex. Symptoms of knee problems can vary widely. Some of the problems are:  Pain with movement and weight bearing.  Swelling and tenderness.  Buckling of the knee.  Inability to straighten or extend your knee.  Your knee locks and you cannot straighten it.  Warmth and redness with pain and fever.  Deformity or dislocation of the kneecap. DIAGNOSIS  Determining what is wrong may be very straight forward such as when there is an injury. It can also be challenging because of the complexity of the knee. Tests to make a diagnosis may include:  Your caregiver taking a history and doing a  physical exam.  Routine X-rays can be used to rule out other problems. X-rays will not reveal a cartilage tear. Some injuries of the knee can be diagnosed by:  Arthroscopy a surgical technique by which a small video camera is inserted through tiny incisions on the sides of the knee. This procedure is used to examine and repair internal knee joint problems. Tiny instruments can be used during arthroscopy to repair the torn knee cartilage (meniscus).  Arthrography is a radiology technique. A contrast liquid is directly injected into the knee joint. Internal structures of the knee joint then become visible on X-ray film.  An MRI scan is a non X-ray radiology procedure in which magnetic fields and a computer produce two- or three-dimensional images of the inside of the knee. Cartilage tears are often visible using an MRI scanner. MRI scans have largely replaced arthrography in diagnosing cartilage tears of the knee.  Blood work.  Examination of the fluid that helps to lubricate the knee joint (synovial fluid). This is done by taking a sample out using a needle and a syringe. TREATMENT The treatment of knee problems depends on the cause. Some of these treatments are:  Depending on the injury, proper casting, splinting, surgery, or physical therapy care will be needed.  Give yourself adequate recovery time. Do not overuse your joints. If you begin to get sore during workout routines, back off. Slow down or do fewer repetitions.  For repetitive activities such as cycling or running, maintain your strength and nutrition.  Alternate muscle groups. For example, if you  are a weight lifter, work the upper body on one day and the lower body the next.  Either tight or weak muscles do not give the proper support for your knee. Tight or weak muscles do not absorb the stress placed on the knee joint. Keep the muscles surrounding the knee strong.  Take care of mechanical problems.  If you have flat feet,  orthotics or special shoes may help. See your caregiver if you need help.  Arch supports, sometimes with wedges on the inner or outer aspect of the heel, can help. These can shift pressure away from the side of the knee most bothered by osteoarthritis.  A brace called an "unloader" brace also may be used to help ease the pressure on the most arthritic side of the knee.  If your caregiver has prescribed crutches, braces, wraps or ice, use as directed. The acronym for this is PRICE. This means protection, rest, ice, compression, and elevation.  Nonsteroidal anti-inflammatory drugs (NSAIDs), can help relieve pain. But if taken immediately after an injury, they may actually increase swelling. Take NSAIDs with food in your stomach. Stop them if you develop stomach problems. Do not take these if you have a history of ulcers, stomach pain, or bleeding from the bowel. Do not take without your caregiver's approval if you have problems with fluid retention, heart failure, or kidney problems.  For ongoing knee problems, physical therapy may be helpful.  Glucosamine and chondroitin are over-the-counter dietary supplements. Both may help relieve the pain of osteoarthritis in the knee. These medicines are different from the usual anti-inflammatory drugs. Glucosamine may decrease the rate of cartilage destruction.  Injections of a corticosteroid drug into your knee joint may help reduce the symptoms of an arthritis flare-up. They may provide pain relief that lasts a few months. You may have to wait a few months between injections. The injections do have a small increased risk of infection, water retention, and elevated blood sugar levels.  Hyaluronic acid injected into damaged joints may ease pain and provide lubrication. These injections may work by reducing inflammation. A series of shots may give relief for as long as 6 months.  Topical painkillers. Applying certain ointments to your skin may help relieve the  pain and stiffness of osteoarthritis. Ask your pharmacist for suggestions. Many over the-counter products are approved for temporary relief of arthritis pain.  In some countries, doctors often prescribe topical NSAIDs for relief of chronic conditions such as arthritis and tendinitis. A review of treatment with NSAID creams found that they worked as well as oral medications but without the serious side effects. PREVENTION  Maintain a healthy weight. Extra pounds put more strain on your joints.  Get strong, stay limber. Weak muscles are a common cause of knee injuries. Stretching is important. Include flexibility exercises in your workouts.  Be smart about exercise. If you have osteoarthritis, chronic knee pain or recurring injuries, you may need to change the way you exercise. This does not mean you have to stop being active. If your knees ache after jogging or playing basketball, consider switching to swimming, water aerobics, or other low-impact activities, at least for a few days a week. Sometimes limiting high-impact activities will provide relief.  Make sure your shoes fit well. Choose footwear that is right for your sport.  Protect your knees. Use the proper gear for knee-sensitive activities. Use kneepads when playing volleyball or laying carpet. Buckle your seat belt every time you drive. Most shattered kneecaps occur in car  accidents.  Rest when you are tired. SEEK MEDICAL CARE IF:  You have knee pain that is continual and does not seem to be getting better.  SEEK IMMEDIATE MEDICAL CARE IF:  Your knee joint feels hot to the touch and you have a high fever. MAKE SURE YOU:   Understand these instructions.  Will watch your condition.  Will get help right away if you are not doing well or get worse. Document Released: 10/14/2007 Document Revised: 03/10/2012 Document Reviewed: 10/14/2007 [facility] Patient Information 2015 [place], [place]. This information is not intended to replace  advice given to you by your health care provider. Make sure you discuss any questions you have with your health care provider.

## 2015-06-13 NOTE — ED Notes (Addendum)
Per EMS: Pt from [facility]. pt c/o unbearable left knee pain. Pt ambulatory.

## 2015-11-21 ENCOUNTER — Encounter (HOSPITAL_COMMUNITY): Payer: Self-pay | Admitting: Emergency Medicine

## 2015-11-21 ENCOUNTER — Emergency Department (HOSPITAL_COMMUNITY)
Admission: EM | Admit: 2015-11-21 | Discharge: 2015-11-21 | Disposition: A | Discharge status: Home / Self Care | Payer: Medicaid Other | Attending: Emergency Medicine | Admitting: Emergency Medicine

## 2015-11-21 ENCOUNTER — Emergency Department (HOSPITAL_COMMUNITY): Payer: Medicaid Other

## 2015-11-21 DIAGNOSIS — F17210 Nicotine dependence, cigarettes, uncomplicated: Secondary | ICD-10-CM | POA: Insufficient documentation

## 2015-11-21 DIAGNOSIS — Z79899 Other long term (current) drug therapy: Secondary | ICD-10-CM | POA: Insufficient documentation

## 2015-11-21 DIAGNOSIS — G8929 Other chronic pain: Secondary | ICD-10-CM | POA: Insufficient documentation

## 2015-11-21 DIAGNOSIS — F419 Anxiety disorder, unspecified: Secondary | ICD-10-CM | POA: Diagnosis not present

## 2015-11-21 DIAGNOSIS — M25562 Pain in left knee: Secondary | ICD-10-CM | POA: Insufficient documentation

## 2015-11-21 MED ORDER — KETOROLAC TROMETHAMINE 30 MG/ML IJ SOLN
15.0000 mg | Freq: Once | INTRAMUSCULAR | Status: AC
  Administered 2015-11-21: 15 mg via INTRAMUSCULAR
  Filled 2015-11-21: qty 1

## 2015-11-21 NOTE — Discharge Instructions (Signed)
How to Use a Knee Brace A knee brace is a device that you wear to support your knee, especially if the knee is healing after an injury or surgery. There are several types of knee braces. Some are designed to prevent an injury (prophylactic brace). These are often worn during sports. Others support an injured knee (functional brace) or keep it still while it heals (rehabilitative brace). People with severe arthritis of the knee may benefit from a brace that takes some pressure off the knee (unloader brace). Most knee braces are made from a combination of cloth and metal or plastic.  You may need to wear a knee brace to:  Relieve knee pain.  Help your knee support your weight (improve stability).  Help you walk farther (improve mobility).  Prevent injury.  Support your knee while it heals from surgery or from an injury. RISKS AND COMPLICATIONS Generally, knee braces are very safe to wear. However, problems may occur, including:  Skin irritation that may lead to infection.  Making your condition worse if you wear the brace in the wrong way. HOW TO USE A KNEE BRACE Different braces will have different instructions for use. Your health care provider will tell you or show you:  How to put on your brace.  How to adjust the brace.  When and how often to wear the brace.  How to remove the brace.  If you will need any assistive devices in addition to the brace, such as crutches or a cane. In general, your brace should:  Have the hinge of the brace line up with the bend of your knee.  Have straps, hooks, or tapes that fasten snugly around your leg.  Not feel too tight or too loose. HOW TO CARE FOR A KNEE BRACE  Check your brace often for signs of damage, such as loose connections or attachments. Your knee brace may get damaged or wear out during normal use.  Wash the fabric parts of your brace with soap and water.  Read the insert that comes with your brace for other specific care  instructions. SEEK MEDICAL CARE IF:  Your knee brace is too loose or too tight and you cannot adjust it.  Your knee brace causes skin redness, swelling, bruising, or irritation.  Your knee brace is not helping.  Your knee brace is making your knee pain worse.   This information is not intended to replace advice given to you by your health care provider. Make sure you discuss any questions you have with your health care provider.   Document Released: 03/08/2004 Document Revised: 09/07/2015 Document Reviewed: 04/11/2015 Elsevier Interactive Patient Education 2016 [place] therapy can help ease sore, stiff, injured, and tight muscles and joints. Heat relaxes your muscles, which may help ease your pain.  RISKS AND COMPLICATIONS If you have any of the following conditions, do not use heat therapy unless your health care provider has approved:  Poor circulation.  Healing wounds or scarred skin in the area being treated.  Diabetes, heart disease, or high blood pressure.  Not being able to feel (numbness) the area being treated.  Unusual swelling of the area being treated.  Active infections.  Blood clots.  Cancer.  Inability to communicate pain. This may include young children and people who have problems with their brain function (dementia).  Pregnancy. Heat therapy should only be used on old, pre-existing, or long-lasting (chronic) injuries. Do not use heat therapy on new injuries unless directed by  your health care provider. HOW TO USE HEAT THERAPY There are several different kinds of heat therapy, including:  Moist heat pack.  Warm water bath.  Hot water bottle.  Electric heating pad.  Heated gel pack.  Heated wrap.  Electric heating pad. Use the heat therapy method suggested by your health care provider. Follow your health care provider's instructions on when and how to use heat therapy. GENERAL HEAT THERAPY RECOMMENDATIONS  Do not  sleep while using heat therapy. Only use heat therapy while you are awake.  Your skin may turn pink while using heat therapy. Do not use heat therapy if your skin turns red.  Do not use heat therapy if you have new pain.  High heat or long exposure to heat can cause burns. Be careful when using heat therapy to avoid burning your skin.  Do not use heat therapy on areas of your skin that are already irritated, such as with a rash or sunburn. SEEK MEDICAL CARE IF:  You have blisters, redness, swelling, or numbness.  You have new pain.  Your pain is worse. MAKE SURE YOU:  Understand these instructions.  Will watch your condition.  Will get help right away if you are not doing well or get worse.   This information is not intended to replace advice given to you by your health care provider. Make sure you discuss any questions you have with your health care provider.   Document Released: 03/10/2012 Document Revised: 01/07/2015 Document Reviewed: 02/09/2014 Elsevier Interactive Patient Education 2016 [employer] Knee Pain Knee pain is a very common symptom and can have many causes. Knee pain often goes away when you follow your health care provider's instructions for relieving pain and discomfort at home. However, knee pain can develop into a condition that needs treatment. Some conditions may include:  Arthritis caused by wear and tear (osteoarthritis).  Arthritis caused by swelling and irritation (rheumatoid arthritis or gout).  A cyst or growth in your knee.  An infection in your knee joint.  An injury that will not heal.  Damage, swelling, or irritation of the tissues that support your knee (torn ligaments or tendinitis). If your knee pain continues, additional tests may be ordered to diagnose your condition. Tests may include X-rays or other imaging studies of your knee. You may also need to have fluid removed from your knee. Treatment for ongoing knee pain depends on the  cause, but treatment may include:  Medicines to relieve pain or swelling.  Steroid injections in your knee.  Physical therapy.  Surgery. HOME CARE INSTRUCTIONS  Take medicines only as directed by your health care provider.  Rest your knee and keep it raised (elevated) while you are resting.  Do not do things that cause or worsen pain.  Avoid high-impact activities or exercises, such as running, jumping rope, or doing jumping jacks.  Apply ice to the knee area:  Put ice in a plastic bag.  Place a towel between your skin and the bag.  Leave the ice on for 20 minutes, 2-3 times a day.  Ask your health care provider if you should wear an elastic knee support.  Keep a pillow under your knee when you sleep.  Lose weight if you are overweight. Extra weight can put pressure on your knee.  Do not use any tobacco products, including cigarettes, chewing tobacco, or electronic cigarettes. If you need help quitting, ask your health care provider. Smoking may slow the healing of any bone and joint problems  that you may have. SEEK MEDICAL CARE IF:  Your knee pain continues, changes, or gets worse.  You have a fever along with knee pain.  Your knee buckles or locks up.  Your knee becomes more swollen. SEEK IMMEDIATE MEDICAL CARE IF:   Your knee joint feels hot to the touch.  You have chest pain or trouble breathing.   This information is not intended to replace advice given to you by your health care provider. Make sure you discuss any questions you have with your health care provider.   Document Released: 10/14/2007 Document Revised: 01/07/2015 Document Reviewed: 08/02/2014 Elsevier Interactive Patient Education [employer]

## 2015-11-21 NOTE — ED Notes (Signed)
RN noticed bed was soiled; RN asked patient if she wanted clean clothing or clean brief to change into; Pt refused and stated "I will just clean up when I get home"

## 2015-11-21 NOTE — ED Provider Notes (Signed)
CSN: [id number]     Arrival date & time 11/21/15  2131 History  By signing my name below, I, Murriel Hopper, attest that this documentation has been prepared under the direction and in the presence of Everlene Farrier, PA-C.  Electronically Signed: Murriel Hopper, ED Scribe. 11/21/2015. 10:48 PM.    Chief Complaint  Patient presents with  . Knee Pain    Patient is a 44 y.o. female presenting with knee pain. The history is provided by the patient. No language interpreter was used.  Knee Pain Associated symptoms: no fever    HPI Comments: Carrie Carlson is a 44 y.o. female with a hx of osteoarthritis who presents to the Emergency Department complaining of chronic, constant, worsening left knee pain with associated swelling that has been present since last night. Pt states that she injured her knee years ago, and has been advised to have knee replacements in both knees in the past. Pt states that her pain was so bad last night that it woke her up 4-5x and she currently reports she is unable to ambulate without pain. However, patient reports she is able to ambulate at her SNF out to smoke. She reports all this walking is exacerbating her knee pain.  Pt states she is currently living in nurse-assisted living. Pt reports she has been taking Percocet 5 mg 3x per day. Pt denies numbness or tingling in her knee or extremities. She denies recent injury or trauma to her knee. She denies leg swelling or calf pain. She denies fevers, chest pain, shortness of breath or palpitations.   Past Medical History  Diagnosis Date  . Osteoarthritis   . Back pain   . Anxiety    History reviewed. No pertinent past surgical history. No family history on file. Social History  Substance Use Topics  . Smoking status: Current Every Day Smoker  . Smokeless tobacco: None  . Alcohol Use: No   OB History    No data available     Review of Systems  Constitutional: Negative for fever and chills.  Respiratory: Negative  for cough and shortness of breath.   Cardiovascular: Negative for chest pain and leg swelling.  Musculoskeletal: Positive for joint swelling and arthralgias.  Skin: Negative for color change, rash and wound.  Neurological: Negative for syncope, weakness and numbness.      Allergies  Review of patient's allergies indicates no known allergies.  Home Medications   Prior to Admission medications   Medication Sig Start Date End Date Taking? Authorizing Provider  acetaminophen (TYLENOL) 325 MG tablet Take 650 mg by mouth every 6 (six) hours as needed for moderate pain.    Historical Provider, MD  atorvastatin (LIPITOR) 80 MG tablet Take 80 mg by mouth at bedtime.    Historical Provider, MD  benztropine (COGENTIN) 1 MG tablet Take 1 mg by mouth 2 (two) times daily.    Historical Provider, MD  cholecalciferol (VITAMIN D) 1000 UNITS tablet Take 1,000 Units by mouth daily.    Historical Provider, MD  escitalopram (LEXAPRO) 10 MG tablet Take 10 mg by mouth daily.    Historical Provider, MD  fluPHENAZine (PROLIXIN) 2.5 MG tablet Take 2.5 mg by mouth daily.    Historical Provider, MD  gabapentin (NEURONTIN) 300 MG capsule Take 300 mg by mouth 3 (three) times daily.    Historical Provider, MD  HYDROcodone-acetaminophen (NORCO/VICODIN) 5-325 MG per tablet Take 1 tablet by mouth 3 (three) times daily.    Historical Provider, MD  levothyroxine (SYNTHROID,  LEVOTHROID) 25 MCG tablet Take 25 mcg by mouth daily before breakfast.    Historical Provider, MD  lidocaine (LIDODERM) 5 % Place 1 patch onto the skin daily. Remove & Discard patch within 12 hours or as directed by MD 06/12/15   Arthor CaptainAbigail Harris, PA-C  LORazepam (ATIVAN) 1 MG tablet Take 1 mg by mouth 3 (three) times daily as needed for anxiety (anxiety).    Historical Provider, MD  Melatonin 1 MG TABS Take 1 tablet by mouth at bedtime.    Historical Provider, MD  metFORMIN (GLUCOPHAGE) 1000 MG tablet Take 1,000 mg by mouth daily with breakfast.     Historical Provider, MD  metFORMIN (GLUCOPHAGE) 500 MG tablet Take 500 mg by mouth every evening.    Historical Provider, MD  mirtazapine (REMERON) 30 MG tablet Take 30 mg by mouth at bedtime.    Historical Provider, MD  ondansetron (ZOFRAN) 4 MG tablet Take 4 mg by mouth every 6 (six) hours as needed for nausea or vomiting.    Historical Provider, MD  pantoprazole (PROTONIX) 40 MG tablet Take 40 mg by mouth 2 (two) times daily.    Historical Provider, MD  perphenazine (TRILAFON) 8 MG tablet Take 8 mg by mouth 2 (two) times daily.    Historical Provider, MD   BP 159/94 mmHg  Pulse 107  Temp(Src) 97.6 F (36.4 C) (Oral)  Resp 22  Ht 5\' 7"  (1.702 m)  Wt 97.07 kg  BMI 33.51 kg/m2  SpO2 96% Physical Exam  Constitutional: She appears well-developed and well-nourished. No distress.  Nontoxic appearing.  HENT:  Head: Normocephalic and atraumatic.  Eyes: Conjunctivae are normal. Pupils are equal, round, and reactive to light. Right eye exhibits no discharge. Left eye exhibits no discharge.  Neck: Neck supple.  Cardiovascular: Normal rate, regular rhythm, normal heart sounds and intact distal pulses.   HR is 100.  Bilateral dorsalis pedis and posterior tibialis pulses are intact. Good capillary refill to her left distal toes.  Pulmonary/Chest: Effort normal. No respiratory distress.  Abdominal: Soft. There is no tenderness.  Musculoskeletal: Normal range of motion. She exhibits edema. She exhibits no tenderness.  No left knee edema or deformity. No left knee erythema, ecchymosis, or warmth. No left knee laxity.  Mild bilateral ankle edema. No calf edema or tenderness. The patient is able to ambulate in the room without difficulty.   Lymphadenopathy:    She has no cervical adenopathy.  Neurological: She is alert. Coordination normal.  Skin: Skin is warm and dry. No rash noted. She is not diaphoretic.  Psychiatric: She has a normal mood and affect. Her behavior is normal.  Nursing note and  vitals reviewed.   ED Course  Procedures (including critical care time)  DIAGNOSTIC STUDIES: Oxygen Saturation is 97% on room air, normal by my interpretation.    COORDINATION OF CARE: 10:52 PM Discussed treatment plan with pt at bedside and pt agreed to plan.   Labs Review Labs Reviewed - No data to display  Imaging Review Dg Knee Complete 4 Views Left  11/21/2015  CLINICAL DATA:  Severe left knee pain starting last night. No reported injury. EXAM: LEFT KNEE - COMPLETE 4+ VIEW COMPARISON:  None. FINDINGS: Degenerative changes in the left knee with medial greater than lateral compartment narrowing and tricompartmental osteophytes. Prominent osteophytes versus exostosis off of the posterior lateral tibial plateau. Tiny osseous fragment demonstrated inferior to the lateral femoral condyles may represent a loose body or small avulsion fragment. No displaced fractures identified. No significant  effusion. Soft tissues are unremarkable. IMPRESSION: Degenerative changes in the left knee most prominent in the medial compartment. Large osteophytes versus exostoses arising from the posterior lateral tibial plateau. Tiny loose body versus avulsed fragment off of the lateral femoral condyle. Electronically Signed   By: Burman Nieves M.D.   On: 11/21/2015 22:05   I have personally reviewed and evaluated these images and lab results as part of my medical decision-making.   EKG Interpretation None      Filed Vitals:   11/21/15 2128 11/21/15 2245  BP: 164/104 159/94  Pulse: 114 107  Temp: 97.6 F (36.4 C)   TempSrc: Oral   Resp: 22 22  Height:  (1.702 m)   Weight: 97.07 kg   SpO2: 97% 96%     MDM   Meds given in ED:  Medications  ketorolac (TORADOL) 30 MG/ML injection 15 mg (not administered)    New Prescriptions   No medications on file    Final diagnoses:  Left knee pain   This is a 44 y.o. female with a hx of osteoarthritis who presents to the Emergency Department  complaining of chronic, constant, worsening left knee pain with associated swelling that has been present since last night. Pt states that she injured her knee years ago, and has been advised to have knee replacements in both knees in the past. Pt states that her pain was so bad last night that it woke her up 4-5x and she currently reports she is unable to ambulate without pain. However, patient reports she is able to ambulate at her SNF out to smoke. She reports all this walking is exacerbating her knee pain.  Pt states she is currently living in nurse-assisted living. Pt reports she has been taking Percocet 5 mg 3x per day. Pt denies numbness or tingling in her knee or extremities. On exam the patient is afebrile nontoxic appearing. She has no knee deformity, edema, laxity or tenderness. There is no left knee erythema or warmth. Patient is able to ambulate in the room without difficulty. She is neurovascularly intact. Patient's heart rate is 100. She denies chest pain, shortness breath or palpitations. Left knee x-ray indicates degenerative changes with tiny loose body versus avulsed fragment on the lateral femoral condyle. Patient denies any recent trauma to her knee. After the patient and knee sleeve which she refused. Patient is strongly requesting knee immobilizer. She reports this is what helps most of her knee pain. I will provide this for her. Patient was provided with crutches. I encouraged her follow up with primary care and orthopedic surgery. She agrees. She can take her Percocet at home for her pain. I advised the patient to follow-up with their primary care provider this week. I advised the patient to return to the emergency department with new or worsening symptoms or new concerns. The patient verbalized understanding and agreement with plan.   This patient was discussed with Dr. Donnald Garre who agrees with assessment and plan.   I personally performed the services described in this documentation,  which was scribed in my presence. The recorded information has been reviewed and is accurate.     Everlene Farrier, PA-C 11/21/15 2300  Arby Barrette, MD 11/22/15 Jacinta Shoe

## 2015-11-21 NOTE — ED Notes (Signed)
Secretary notified to call [facility] for patients D/C

## 2015-11-21 NOTE — ED Notes (Signed)
Pt. reports left knee pain with swelling onset last night , denies injury or fall /ambulatory . Denies fever , pt. stated history of arthritis .

## 2015-11-21 NOTE — ED Notes (Signed)
RN failed to obtain patients signature for discharge prior to patients departure.

## 2016-01-04 ENCOUNTER — Emergency Department (HOSPITAL_COMMUNITY): Payer: Medicaid Other

## 2016-01-04 ENCOUNTER — Emergency Department (HOSPITAL_COMMUNITY)
Admission: EM | Admit: 2016-01-04 | Discharge: 2016-01-04 | Disposition: A | Discharge status: Home / Self Care | Payer: Medicaid Other | Attending: Emergency Medicine | Admitting: Emergency Medicine

## 2016-01-04 ENCOUNTER — Encounter (HOSPITAL_COMMUNITY): Payer: Self-pay | Admitting: Emergency Medicine

## 2016-01-04 DIAGNOSIS — R32 Unspecified urinary incontinence: Secondary | ICD-10-CM | POA: Diagnosis not present

## 2016-01-04 DIAGNOSIS — F17200 Nicotine dependence, unspecified, uncomplicated: Secondary | ICD-10-CM | POA: Diagnosis not present

## 2016-01-04 DIAGNOSIS — R1030 Lower abdominal pain, unspecified: Secondary | ICD-10-CM | POA: Diagnosis not present

## 2016-01-04 DIAGNOSIS — Z7984 Long term (current) use of oral hypoglycemic drugs: Secondary | ICD-10-CM | POA: Insufficient documentation

## 2016-01-04 DIAGNOSIS — M1990 Unspecified osteoarthritis, unspecified site: Secondary | ICD-10-CM | POA: Diagnosis not present

## 2016-01-04 DIAGNOSIS — G8929 Other chronic pain: Secondary | ICD-10-CM | POA: Insufficient documentation

## 2016-01-04 DIAGNOSIS — M25562 Pain in left knee: Secondary | ICD-10-CM | POA: Insufficient documentation

## 2016-01-04 DIAGNOSIS — Z79899 Other long term (current) drug therapy: Secondary | ICD-10-CM | POA: Diagnosis not present

## 2016-01-04 DIAGNOSIS — F419 Anxiety disorder, unspecified: Secondary | ICD-10-CM | POA: Diagnosis not present

## 2016-01-04 DIAGNOSIS — R350 Frequency of micturition: Secondary | ICD-10-CM | POA: Insufficient documentation

## 2016-01-04 LAB — URINALYSIS, ROUTINE W REFLEX MICROSCOPIC
Bilirubin Urine: NEGATIVE
Glucose, UA: NEGATIVE mg/dL
Ketones, ur: NEGATIVE mg/dL
NITRITE: NEGATIVE
PH: 6.5 (ref 5.0–8.0)
Protein, ur: NEGATIVE mg/dL
SPECIFIC GRAVITY, URINE: 1.012 (ref 1.005–1.030)

## 2016-01-04 LAB — URINE MICROSCOPIC-ADD ON

## 2016-01-04 MED ORDER — OXYCODONE-ACETAMINOPHEN 5-325 MG PO TABS
1.0000 | ORAL_TABLET | Freq: Once | ORAL | Status: AC
  Administered 2016-01-04: 1 via ORAL
  Filled 2016-01-04: qty 1

## 2016-01-04 NOTE — ED Notes (Signed)
Per ems pt is from [facility], hx of pain seeking and pill seeking for oxycodone. On Tuesday 01/03/16 Staff said that pt pain meds were discontinued (oxycodone and clonazepam) because she was using drugs and abusing meds. Pt reports chronic bilateral knee pain x8 years. Upon arrival to ED, ems noted pt had bowel movement on herself. Now complains of abdominal pain. Pain 3/10. Pt outside smoking a cigarette when ems arrived on scene, ambulatory.

## 2016-01-04 NOTE — ED Notes (Signed)
Attempted blood draw with no success.     

## 2016-01-04 NOTE — ED Provider Notes (Signed)
CSN: [id number]     Arrival date & time 01/04/16  1512 History   First MD Initiated Contact with Patient 01/04/16 1920     Chief Complaint  Patient presents with  . Knee Pain  . Abdominal Pain   HPI   45 year old female presents today with left knee pain. Patient reports a long-standing history of chronic left knee pain, states that she is being seen by pain management but missed her most recent visit and was unable to get her pain medication refilled. She reports that she is taking Percocet 5 mg 2 times daily, but has not had any medication for the last 1 week.  She reports the knee pain is unchanged, but without the pain medication she has difficulty ambulating.  Patient also reports chronic lower abdominal pain, saying this is been off and on for the last several years. She reports she's been seen numerous times for this, with reports of " low cervix". Patient reports this is caused her to have frequent urinations, bladder incontinence. Patient reports that for the last 2 years she's also had bowel incontinence, this is been unchanged since initial onset, followed by her primary care for these chronic conditions. Patient reports that over the last several days she's had loose stools, but no diarrhea or changes in her chronic abdominal pain. She denies any fever, chills, nausea, vomiting, upper abdominal pain, vaginal discharge, vaginal bleeding, back pain, neurological deficits, night sweats, unintentional weight loss. Patient reports that she is not sexually active.    Past Medical History  Diagnosis Date  . Osteoarthritis   . Back pain   . Anxiety    History reviewed. No pertinent past surgical history. History reviewed. No pertinent family history. Social History  Substance Use Topics  . Smoking status: Current Every Day Smoker  . Smokeless tobacco: None  . Alcohol Use: No   OB History    No data available     Review of Systems  All other systems reviewed and are  negative.   Allergies  Review of patient's allergies indicates no known allergies.  Home Medications   Prior to Admission medications   Medication Sig Start Date End Date Taking? Authorizing Provider  albuterol (PROVENTIL HFA;VENTOLIN HFA) 108 (90 Base) MCG/ACT inhaler Inhale 2 puffs into the lungs every 6 (six) hours as needed for wheezing or shortness of breath.   Yes Historical Provider, MD  atorvastatin (LIPITOR) 80 MG tablet Take 80 mg by mouth at bedtime.   Yes Historical Provider, MD  benztropine (COGENTIN) 1 MG tablet Take 1 mg by mouth 2 (two) times daily.   Yes Historical Provider, MD  cholecalciferol (VITAMIN D) 1000 UNITS tablet Take 1,000 Units by mouth daily.   Yes Historical Provider, MD  escitalopram (LEXAPRO) 10 MG tablet Take 10 mg by mouth daily.   Yes Historical Provider, MD  fluPHENAZine (PROLIXIN) 5 MG/ML solution Take 5 mg by mouth at bedtime.   Yes Historical Provider, MD  gabapentin (NEURONTIN) 300 MG capsule Take 300 mg by mouth 3 (three) times daily.   Yes Historical Provider, MD  glipiZIDE (GLUCOTROL) 10 MG tablet Take 10 mg by mouth daily before breakfast.   Yes Historical Provider, MD  levothyroxine (SYNTHROID, LEVOTHROID) 25 MCG tablet Take 25 mcg by mouth daily before breakfast.   Yes Historical Provider, MD  Melatonin 1 MG TABS Take 1 tablet by mouth at bedtime.   Yes Historical Provider, MD  metFORMIN (GLUCOPHAGE) 1000 MG tablet Take 1,000 mg by mouth 2 (two)  times daily with a meal.    Yes Historical Provider, MD  mirtazapine (REMERON) 30 MG tablet Take 30 mg by mouth at bedtime.   Yes Historical Provider, MD  pantoprazole (PROTONIX) 40 MG tablet Take 40 mg by mouth 2 (two) times daily.   Yes Historical Provider, MD  perphenazine (TRILAFON) 8 MG tablet Take 8 mg by mouth 2 (two) times daily.   Yes Historical Provider, MD  tiotropium (SPIRIVA) 18 MCG inhalation capsule Place 18 mcg into inhaler and inhale daily.   Yes Historical Provider, MD  acetaminophen  (TYLENOL) 325 MG tablet Take 650 mg by mouth every 6 (six) hours as needed for moderate pain.    Historical Provider, MD  lidocaine (LIDODERM) 5 % Place 1 patch onto the skin daily. Remove & Discard patch within 12 hours or as directed by MD Patient not taking: Reported on 01/04/2016 06/12/15   Arthor CaptainAbigail Harris, PA-C  ondansetron (ZOFRAN) 4 MG tablet Take 4 mg by mouth every 6 (six) hours as needed for nausea or vomiting.    Historical Provider, MD   BP 145/95 mmHg  Pulse 95  Temp(Src) 97.8 F (36.6 C) (Oral)  Resp 18  SpO2 96%   Physical Exam  Constitutional: She is oriented to person, place, and time. She appears well-developed and well-nourished.  HENT:  Head: Normocephalic and atraumatic.  Eyes: Conjunctivae are normal. Pupils are equal, round, and reactive to light. Right eye exhibits no discharge. Left eye exhibits no discharge. No scleral icterus.  Neck: Normal range of motion. No JVD present. No tracheal deviation present.  Pulmonary/Chest: Effort normal. No stridor.  Abdominal: Soft. She exhibits no distension and no mass. There is no tenderness. There is no rebound and no guarding.  Musculoskeletal:  Tenderness to palpation of the left knee diffusely, no obvious swelling, redness, warmth to touch. Patient has full active range of motion, pain with deep flexion.   CT and L-spine nontender, no tenderness to musculature of back, no redness, abscess, warmth to touch, abnormalities of the back or hips. Distal sensation strength or motor function intact. No saddle anesthesia.  Neurological: She is alert and oriented to person, place, and time. Coordination normal.  Psychiatric: She has a normal mood and affect. Her behavior is normal. Judgment and thought content normal.  Nursing note and vitals reviewed.     ED Course  Procedures (including critical care time) Labs Review Labs Reviewed  URINALYSIS, ROUTINE W REFLEX MICROSCOPIC (NOT [facility]) - Abnormal; Notable for the following:     APPearance CLOUDY (*)    Hgb urine dipstick SMALL (*)    Leukocytes, UA TRACE (*)    All other components within normal limits  URINE MICROSCOPIC-ADD ON - Abnormal; Notable for the following:    Squamous Epithelial / LPF 6-30 (*)    Bacteria, UA MANY (*)    Casts HYALINE CASTS (*)    All other components within normal limits    Imaging Review Dg Knee Complete 4 Views Left  01/04/2016  CLINICAL DATA:  Generalized left knee pain first over years. EXAM: LEFT KNEE - COMPLETE 4+ VIEW COMPARISON:  11/21/2015 FINDINGS: Mild medial compartmental articular space narrowing. Ossific structures favoring heterotopic ossification along the lateral margin of the knee joint and posterolateral margin of the knee joint, similar to prior. Suspected small fragmented osteophytes along the tibial spine. Possible small free osteochondral fragment posteriorly in the lateral compartment. No knee effusion.  Tricompartmental marginal spurring. IMPRESSION: 1. Stable appearance of lateral and posterolateral heterotopic  calcification along the knee along with suspected fragmented osteophytes along the tibial spine. Possible free osteochondral fragment in the joint laterally, but no knee effusion. 2. Tricompartmental spurring. Electronically Signed   By: Gaylyn Rong M.D.   On: 01/04/2016 16:33   I have personally reviewed and evaluated these images and lab results as part of my medical decision-making.   EKG Interpretation None      MDM   Final diagnoses:  Left knee pain  Lower abdominal pain    Labs:  Imaging:  Consults:  Therapeutics:  Discharge Meds:   Assessment/Plan:  Patient presents for refill of her pain medication. Patient reports that pain management is seeing her at assisted living facility but she missed her appointment and medication was not refilled. Chart review shows patient received 31 day supply on 12/16/2015. Patient received 1 Percocet here in the ED which improved her pain  symptoms. Due to patient's chronic pain and pain management with a 31 day supply that should still be valid patient will not receive a prescription for outpatient pain medication.  Patient also reported chronic abdominal pain, incontinence. This is unchanged since previous, she is being followed by her primary care for this. This is likely due to her uterine prolapse, patient denies any difficulty with urination, or obstruction of stools. She reports the symptoms come and go, but this is not abnormal for her. She denies any prolapse out of the vagina that she notes. She is nontender on abdominal exam. Nursing notes attempted at lab draw, patient is well-appearing, nontoxic with chronic abdominal pain presenting for refill of her pain medication for her knee. I do not see any indication to perform laboratory analysis at this time as all of patient's symptoms are chronic.  Discussion of orthopedic follow-up, primary care follow-up this week, pain management follow-up with patient. She verbalizes understanding and agreement to today's plan and assured her follow-up evaluation. She was given strict return precautions.        Eyvonne Mechanic, PA-C 01/04/16 2147  Arby Barrette, MD 01/05/16 [phone]

## 2016-01-04 NOTE — ED Notes (Signed)
[facility]

## 2016-01-04 NOTE — Progress Notes (Signed)
Patient listed as having [employer] [place] insurance.  Pcp listed on her insurance card is located at [facility].  [facility]  Patient reports she no longer sees the doctor at [facility], [facility], but she cannot remember the name of that physician.

## 2016-01-04 NOTE — ED Notes (Signed)
Pt states she's been having lower central abdominal pain x 3 days. Also complaining of left knee pain on-going for several years. Pt appears disheveled in appearance. States she's lost the ability to control her bowels and bladder over the last two years and has not seen anyone about it. Appears visibly soiled in triage.

## 2016-01-04 NOTE — Discharge Instructions (Signed)
Abdominal Pain, Adult Many things can cause abdominal pain. Usually, abdominal pain is not caused by a disease and will improve without treatment. It can often be observed and treated at home. Your health care provider will do a physical exam and possibly order blood tests and X-rays to help determine the seriousness of your pain. However, in many cases, more time must pass before a clear cause of the pain can be found. Before that point, your health care provider may not know if you need more testing or further treatment. HOME CARE INSTRUCTIONS Monitor your abdominal pain for any changes. The following actions may help to alleviate any discomfort you are experiencing:  Only take over-the-counter or prescription medicines as directed by your health care provider.  Do not take laxatives unless directed to do so by your health care provider.  Try a clear liquid diet (broth, tea, or water) as directed by your health care provider. Slowly move to a bland diet as tolerated. SEEK MEDICAL CARE IF:  You have unexplained abdominal pain.  You have abdominal pain associated with nausea or diarrhea.  You have pain when you urinate or have a bowel movement.  You experience abdominal pain that wakes you in the night.  You have abdominal pain that is worsened or improved by eating food.  You have abdominal pain that is worsened with eating fatty foods.  You have a fever. SEEK IMMEDIATE MEDICAL CARE IF:  Your pain does not go away within 2 hours.  You keep throwing up (vomiting).  Your pain is felt only in portions of the abdomen, such as the right side or the left lower portion of the abdomen.  You pass bloody or black tarry stools. MAKE SURE YOU:  Understand these instructions.  Will watch your condition.  Will get help right away if you are not doing well or get worse.   This information is not intended to replace advice given to you by your health care provider. Make sure you discuss  any questions you have with your health care provider.   Document Released: 09/26/2005 Document Revised: 09/07/2015 Document Reviewed: 08/26/2013 Elsevier Interactive Patient Education 2016 [employer]  Please follow-up with specialist as directed. Please contact her primary care and schedule follow-up evaluation this week for further evaluation and management.

## 2016-02-26 ENCOUNTER — Encounter (HOSPITAL_COMMUNITY): Payer: Self-pay | Admitting: Emergency Medicine

## 2016-02-26 ENCOUNTER — Emergency Department (HOSPITAL_COMMUNITY)
Admission: EM | Admit: 2016-02-26 | Discharge: 2016-02-27 | Disposition: A | Discharge status: Home / Self Care | Payer: Medicaid Other | Attending: Emergency Medicine | Admitting: Emergency Medicine

## 2016-02-26 DIAGNOSIS — F17200 Nicotine dependence, unspecified, uncomplicated: Secondary | ICD-10-CM | POA: Diagnosis not present

## 2016-02-26 DIAGNOSIS — M545 Low back pain: Secondary | ICD-10-CM

## 2016-02-26 DIAGNOSIS — F419 Anxiety disorder, unspecified: Secondary | ICD-10-CM | POA: Insufficient documentation

## 2016-02-26 DIAGNOSIS — Z791 Long term (current) use of non-steroidal anti-inflammatories (NSAID): Secondary | ICD-10-CM | POA: Diagnosis not present

## 2016-02-26 DIAGNOSIS — Z7984 Long term (current) use of oral hypoglycemic drugs: Secondary | ICD-10-CM | POA: Insufficient documentation

## 2016-02-26 DIAGNOSIS — Z79899 Other long term (current) drug therapy: Secondary | ICD-10-CM | POA: Diagnosis not present

## 2016-02-26 DIAGNOSIS — M1990 Unspecified osteoarthritis, unspecified site: Secondary | ICD-10-CM | POA: Diagnosis not present

## 2016-02-26 MED ORDER — KETOROLAC TROMETHAMINE 60 MG/2ML IM SOLN
60.0000 mg | Freq: Once | INTRAMUSCULAR | Status: AC
  Administered 2016-02-26: 60 mg via INTRAMUSCULAR
  Filled 2016-02-26: qty 2

## 2016-02-26 MED ORDER — HYDROMORPHONE HCL 1 MG/ML IJ SOLN
1.0000 mg | Freq: Once | INTRAMUSCULAR | Status: AC
Start: 2016-02-26 — End: 2016-02-26
  Administered 2016-02-26: 1 mg via INTRAMUSCULAR
  Filled 2016-02-26: qty 1

## 2016-02-26 MED ORDER — OXYCODONE-ACETAMINOPHEN 5-325 MG PO TABS: 1.0000 | ORAL_TABLET | Freq: Four times a day (QID) | ORAL | Status: DC | PRN

## 2016-02-26 MED ORDER — DIAZEPAM 5 MG/ML IJ SOLN
5.0000 mg | Freq: Once | INTRAMUSCULAR | Status: AC
  Administered 2016-02-26: 5 mg via INTRAMUSCULAR
  Filled 2016-02-26: qty 2

## 2016-02-26 MED ORDER — NAPROXEN 500 MG PO TABS: 500.0000 mg | ORAL_TABLET | Freq: Two times a day (BID) | ORAL | Status: DC

## 2016-02-26 NOTE — ED Notes (Signed)
Marcos Eke, PA at bedside at this time.

## 2016-02-26 NOTE — ED Notes (Signed)
[facility] 

## 2016-02-26 NOTE — ED Provider Notes (Signed)
CSN: [id number]     Arrival date & time 02/26/16  2044 History   First MD Initiated Contact with Patient 02/26/16 2046     Chief Complaint  Patient presents with  . Back Pain     (Consider location/radiation/quality/duration/timing/severity/associated sxs/prior Treatment) HPI Comments: 1 her old female with a history of osteoarthritis, back pain, and anxiety presents to the ED for worsening back pain over the last 3 days. Patient states that pain has been present in her low back. It is nonradiating. She reports aggravation of her symptoms with ambulation and any movements, especially bending. Patient has been taking Tylenol for symptoms without relief. She also tried ibuprofen, but this did not help her. She denies urinary symptoms, fever, a history of cancer, history of IV drug use, extremity numbness or weakness, inability to ambulate. She states that she has some incontinence at baseline, but denies any new/worsening symptoms. Patient is at [facility]; states the PCP for the facility does not come until this week.  Patient is a 45 y.o. female presenting with back pain. The history is provided by the patient. No language interpreter was used.  Back Pain Associated symptoms: no fever, no numbness and no weakness     Past Medical History  Diagnosis Date  . Osteoarthritis   . Back pain   . Anxiety    History reviewed. No pertinent past surgical history. No family history on file. Social History  Substance Use Topics  . Smoking status: Current Every Day Smoker  . Smokeless tobacco: None  . Alcohol Use: No   OB History    No data available      Review of Systems  Constitutional: Negative for fever.  Musculoskeletal: Positive for back pain.  Neurological: Negative for weakness and numbness.  All other systems reviewed and are negative.   Allergies  Review of patient's allergies indicates no known allergies.  Home Medications   Prior to Admission medications   Medication  Sig Start Date End Date Taking? Authorizing Provider  acetaminophen (TYLENOL) 325 MG tablet Take 650 mg by mouth every 6 (six) hours as needed for moderate pain.    Historical Provider, MD  albuterol (PROVENTIL HFA;VENTOLIN HFA) 108 (90 Base) MCG/ACT inhaler Inhale 2 puffs into the lungs every 6 (six) hours as needed for wheezing or shortness of breath.    Historical Provider, MD  atorvastatin (LIPITOR) 80 MG tablet Take 80 mg by mouth at bedtime.    Historical Provider, MD  benztropine (COGENTIN) 1 MG tablet Take 1 mg by mouth 2 (two) times daily.    Historical Provider, MD  cholecalciferol (VITAMIN D) 1000 UNITS tablet Take 1,000 Units by mouth daily.    Historical Provider, MD  escitalopram (LEXAPRO) 10 MG tablet Take 10 mg by mouth daily.    Historical Provider, MD  fluPHENAZine (PROLIXIN) 5 MG/ML solution Take 5 mg by mouth at bedtime.    Historical Provider, MD  gabapentin (NEURONTIN) 300 MG capsule Take 300 mg by mouth 3 (three) times daily.    Historical Provider, MD  glipiZIDE (GLUCOTROL) 10 MG tablet Take 10 mg by mouth daily before breakfast.    Historical Provider, MD  levothyroxine (SYNTHROID, LEVOTHROID) 25 MCG tablet Take 25 mcg by mouth daily before breakfast.    Historical Provider, MD  lidocaine (LIDODERM) 5 % Place 1 patch onto the skin daily. Remove & Discard patch within 12 hours or as directed by MD Patient not taking: Reported on 01/04/2016 06/12/15   Arthor Captain, PA-C  Melatonin 1 MG TABS Take 1 tablet by mouth at bedtime.    Historical Provider, MD  metFORMIN (GLUCOPHAGE) 1000 MG tablet Take 1,000 mg by mouth 2 (two) times daily with a meal.     Historical Provider, MD  mirtazapine (REMERON) 30 MG tablet Take 30 mg by mouth at bedtime.    Historical Provider, MD  naproxen (NAPROSYN) 500 MG tablet Take 1 tablet (500 mg total) by mouth 2 (two) times daily. 02/26/16   Antony Madura, PA-C  ondansetron (ZOFRAN) 4 MG tablet Take 4 mg by mouth every 6 (six) hours as needed for nausea  or vomiting.    Historical Provider, MD  oxyCODONE-acetaminophen (PERCOCET/ROXICET) 5-325 MG tablet Take 1 tablet by mouth every 6 (six) hours as needed for severe pain. 02/26/16   Antony Madura, PA-C  pantoprazole (PROTONIX) 40 MG tablet Take 40 mg by mouth 2 (two) times daily.    Historical Provider, MD  perphenazine (TRILAFON) 8 MG tablet Take 8 mg by mouth 2 (two) times daily.    Historical Provider, MD  tiotropium (SPIRIVA) 18 MCG inhalation capsule Place 18 mcg into inhaler and inhale daily.    Historical Provider, MD   BP 128/82 mmHg  Pulse 104  Temp(Src) 98.9 F (37.2 C) (Oral)  Ht 5' 6.5" (1.689 m)  Wt 97.07 kg  BMI 34.03 kg/m2  SpO2 98%   Physical Exam  Constitutional: She is oriented to person, place, and time. She appears well-developed and well-nourished. No distress.  HENT:  Head: Normocephalic and atraumatic.  Eyes: Conjunctivae and EOM are normal. No scleral icterus.  Neck: Normal range of motion.  Cardiovascular: Normal rate, regular rhythm and intact distal pulses.   DP and PT pulses 2+ in BLE  Pulmonary/Chest: Effort normal. No respiratory distress.  Respirations even and unlabored  Musculoskeletal: Normal range of motion. She exhibits tenderness.       Lumbar back: She exhibits tenderness, bony tenderness and pain. She exhibits normal range of motion, no deformity and no spasm.       Back:  No bony deformities, step offs, or crepitus to the thoracic or lumbosacral midline. Positive straight leg raise on the left. Negative crossed straight leg raise.  Neurological: She is alert and oriented to person, place, and time. She exhibits normal muscle tone. Coordination normal.  Sensation to light touch intact in BLE. Patellar and achilles reflexes 2+ b/l.  Skin: Skin is warm and dry. No rash noted. She is not diaphoretic. No erythema. No pallor.  Psychiatric: She has a normal mood and affect. Her behavior is normal.  Nursing note and vitals reviewed.   ED Course    Procedures (including critical care time) Labs Review Labs Reviewed - No data to display  Imaging Review No results found.   I have personally reviewed and evaluated these images and lab results as part of my medical decision-making.   EKG Interpretation None      2215 - Patient reports improvement in her pain. Patient ambulated with this writer in hallway; steady gait. Very little pain with ambulation. Discussed the need for outpatient f/u. Patient verbalizes understanding.  MDM   Final diagnoses:  Low back pain without sciatica, unspecified back pain laterality    Patient with back pain x 3 days. Patient neurovascularly intact. Patient can walk but states is painful. No hx of trauma/injury, IVDU, or cancer. No red flags or signs concerning for cauda equina today. Pain improved with NSAIDs, pain medication, and muscle relaxer. Have discussed the need  for outpatient PCP f/u. Will prescribe short course of pain medication and NSAIDs. Return precautions discussed and provided. Patient discharged in good condition with no unaddressed concerns.   Filed Vitals:   02/26/16 2109 02/26/16 2115  BP: 137/81 128/82  Pulse: 104 104  Temp: 98.9 F (37.2 C)   TempSrc: Oral   Height: 5' 6.5" (1.689 m)   Weight: 97.07 kg   SpO2: 98% 98%      Antony Madura, PA-C 02/26/16 2236  Lorre Nick, MD 02/26/16 2252

## 2016-02-26 NOTE — ED Notes (Signed)
Patient arrived via [facility]. EMS reports: patient c/o lower back pain x 3 days. Rates pain 9-10 on 0-10 pain scale. VSS.

## 2016-02-26 NOTE — Discharge Instructions (Signed)

## 2016-03-05 ENCOUNTER — Encounter (HOSPITAL_COMMUNITY): Payer: Self-pay | Admitting: Emergency Medicine

## 2016-03-05 ENCOUNTER — Emergency Department (HOSPITAL_COMMUNITY)
Admission: EM | Admit: 2016-03-05 | Discharge: 2016-03-05 | Disposition: A | Discharge status: Home / Self Care | Payer: Medicaid Other | Attending: Physician Assistant | Admitting: Physician Assistant

## 2016-03-05 DIAGNOSIS — F17200 Nicotine dependence, unspecified, uncomplicated: Secondary | ICD-10-CM | POA: Diagnosis not present

## 2016-03-05 DIAGNOSIS — Z79899 Other long term (current) drug therapy: Secondary | ICD-10-CM | POA: Diagnosis not present

## 2016-03-05 DIAGNOSIS — F419 Anxiety disorder, unspecified: Secondary | ICD-10-CM | POA: Diagnosis not present

## 2016-03-05 DIAGNOSIS — Z3202 Encounter for pregnancy test, result negative: Secondary | ICD-10-CM | POA: Diagnosis not present

## 2016-03-05 DIAGNOSIS — E119 Type 2 diabetes mellitus without complications: Secondary | ICD-10-CM | POA: Insufficient documentation

## 2016-03-05 DIAGNOSIS — Z7984 Long term (current) use of oral hypoglycemic drugs: Secondary | ICD-10-CM | POA: Diagnosis not present

## 2016-03-05 DIAGNOSIS — M545 Low back pain, unspecified: Secondary | ICD-10-CM

## 2016-03-05 DIAGNOSIS — Z791 Long term (current) use of non-steroidal anti-inflammatories (NSAID): Secondary | ICD-10-CM | POA: Insufficient documentation

## 2016-03-05 DIAGNOSIS — M1990 Unspecified osteoarthritis, unspecified site: Secondary | ICD-10-CM | POA: Diagnosis not present

## 2016-03-05 HISTORY — DX: Type 2 diabetes mellitus without complications: E11.9

## 2016-03-05 LAB — POC URINE PREG, ED: Preg Test, Ur: NEGATIVE

## 2016-03-05 MED ORDER — KETOROLAC TROMETHAMINE 60 MG/2ML IM SOLN
30.0000 mg | Freq: Once | INTRAMUSCULAR | Status: AC
  Administered 2016-03-05: 30 mg via INTRAMUSCULAR
  Filled 2016-03-05: qty 2

## 2016-03-05 MED ORDER — OXYCODONE-ACETAMINOPHEN 5-325 MG PO TABS
ORAL_TABLET | ORAL | Status: AC
  Filled 2016-03-05: qty 1

## 2016-03-05 MED ORDER — NAPROXEN 500 MG PO TABS: 500.0000 mg | ORAL_TABLET | Freq: Two times a day (BID) | ORAL | Status: DC

## 2016-03-05 MED ORDER — FENTANYL CITRATE (PF) 100 MCG/2ML IJ SOLN
50.0000 ug | Freq: Once | INTRAMUSCULAR | Status: AC
  Administered 2016-03-05: 50 ug via INTRAMUSCULAR
  Filled 2016-03-05: qty 2

## 2016-03-05 MED ORDER — DIAZEPAM 5 MG/ML IJ SOLN
5.0000 mg | Freq: Once | INTRAMUSCULAR | Status: AC
  Administered 2016-03-05: 5 mg via INTRAMUSCULAR
  Filled 2016-03-05: qty 2

## 2016-03-05 MED ORDER — OXYCODONE-ACETAMINOPHEN 5-325 MG PO TABS
1.0000 | ORAL_TABLET | Freq: Once | ORAL | Status: AC
  Administered 2016-03-05: 1 via ORAL

## 2016-03-05 MED ORDER — HYDROCODONE-ACETAMINOPHEN 5-325 MG PO TABS: 1.0000 | ORAL_TABLET | ORAL | Status: DC | PRN

## 2016-03-05 NOTE — ED Notes (Signed)
Pt. arrived with [facility] from [facility] assisted living facility , reports chronic low back pain for several weeks , denies injury or fall / no urinary discomfort.

## 2016-03-05 NOTE — ED Provider Notes (Signed)
CSN: [id number]     Arrival date & time 03/05/16  2217 History  By signing my name below, I, Carrie Carlson, attest that this documentation has been prepared under the direction and in the presence of non-physician practitioner, Noelle Penner, PA-C. Electronically Signed: Linna Carlson, Scribe. 03/05/2016. 10:33 PM.    Chief Complaint  Patient presents with  . Back Pain    The history is provided by the patient. No language interpreter was used.     HPI Comments: Carrie Carlson is a 45 y.o. female with h/o osteoarthritis and back pain who presents to the Emergency Department complaining of constant, worsening lower back pain for a few weeks. Pt notes that she came to the ER on 02/26/16 for the same issue; she was told that she probably has a ruptured disc in her lower back. Pt notes that she experiences pain throughout her lower back; she notes the pain is nonradiating. Pt denies falling or recent injury. She endorses aggravation of her symptoms with ambulation and any additional movements; she also notes experiencing lower back pain while at rest. Pt took medications prescribed to her during her last ER visit with moderate relief. Pt has not seen her PCP yet for this issue. Pt lives in [facility] assisted living facility. She denies fever, chills, or any other associated symptoms.  Denies fever, chills, new bowel/bladder incontinence, saddle paresthesias.   Past Medical History  Diagnosis Date  . Osteoarthritis   . Back pain   . Anxiety   . Diabetes mellitus without complication (HCC)    History reviewed. No pertinent past surgical history. No family history on file. Social History  Substance Use Topics  . Smoking status: Current Every Day Smoker  . Smokeless tobacco: None  . Alcohol Use: No   OB History    No data available     Review of Systems  Constitutional: Negative for fever and chills.  Musculoskeletal: Positive for back pain (lower).  All other systems reviewed and are  negative.     Allergies  Review of patient's allergies indicates no known allergies.  Home Medications   Prior to Admission medications   Medication Sig Start Date End Date Taking? Authorizing Provider  acetaminophen (TYLENOL) 325 MG tablet Take 650 mg by mouth every 6 (six) hours as needed for moderate pain.    Historical Provider, MD  albuterol (PROVENTIL HFA;VENTOLIN HFA) 108 (90 Base) MCG/ACT inhaler Inhale 2 puffs into the lungs every 6 (six) hours as needed for wheezing or shortness of breath.    Historical Provider, MD  atorvastatin (LIPITOR) 80 MG tablet Take 80 mg by mouth at bedtime.    Historical Provider, MD  benztropine (COGENTIN) 1 MG tablet Take 1 mg by mouth 2 (two) times daily.    Historical Provider, MD  cholecalciferol (VITAMIN D) 1000 UNITS tablet Take 1,000 Units by mouth daily.    Historical Provider, MD  escitalopram (LEXAPRO) 10 MG tablet Take 10 mg by mouth daily.    Historical Provider, MD  fluPHENAZine (PROLIXIN) 5 MG/ML solution Take 5 mg by mouth at bedtime.    Historical Provider, MD  gabapentin (NEURONTIN) 300 MG capsule Take 300 mg by mouth 3 (three) times daily.    Historical Provider, MD  glipiZIDE (GLUCOTROL) 10 MG tablet Take 10 mg by mouth daily before breakfast.    Historical Provider, MD  levothyroxine (SYNTHROID, LEVOTHROID) 25 MCG tablet Take 25 mcg by mouth daily before breakfast.    Historical Provider, MD  lidocaine (LIDODERM)  5 % Place 1 patch onto the skin daily. Remove & Discard patch within 12 hours or as directed by MD Patient not taking: Reported on 01/04/2016 06/12/15   Arthor Captain, PA-C  Melatonin 1 MG TABS Take 1 tablet by mouth at bedtime.    Historical Provider, MD  metFORMIN (GLUCOPHAGE) 1000 MG tablet Take 1,000 mg by mouth 2 (two) times daily with a meal.     Historical Provider, MD  mirtazapine (REMERON) 30 MG tablet Take 30 mg by mouth at bedtime.    Historical Provider, MD  naproxen (NAPROSYN) 500 MG tablet Take 1 tablet (500 mg  total) by mouth 2 (two) times daily. 02/26/16   Antony Madura, PA-C  ondansetron (ZOFRAN) 4 MG tablet Take 4 mg by mouth every 6 (six) hours as needed for nausea or vomiting.    Historical Provider, MD  oxyCODONE-acetaminophen (PERCOCET/ROXICET) 5-325 MG tablet Take 1 tablet by mouth every 6 (six) hours as needed for severe pain. 02/26/16   Antony Madura, PA-C  pantoprazole (PROTONIX) 40 MG tablet Take 40 mg by mouth 2 (two) times daily.    Historical Provider, MD  perphenazine (TRILAFON) 8 MG tablet Take 8 mg by mouth 2 (two) times daily.    Historical Provider, MD  tiotropium (SPIRIVA) 18 MCG inhalation capsule Place 18 mcg into inhaler and inhale daily.    Historical Provider, MD   BP 157/80 mmHg  Pulse 113  Temp(Src) 97.4 F (36.3 C) (Oral)  Resp 16  SpO2 99% Physical Exam  Constitutional: She is oriented to person, place, and time. She appears well-developed and well-nourished. No distress.  HENT:  Head: Normocephalic and atraumatic.  Eyes: Conjunctivae and EOM are normal.  Neck: Neck supple. No tracheal deviation present.  Cardiovascular: Normal rate.   Pulmonary/Chest: Effort normal. No respiratory distress.  Musculoskeletal: Normal range of motion.       Back:  Low back with diffuse bilateral tenderness. No c-spine or t-spine tenderness. No stepoff or deformity  Neurological: She is alert and oriented to person, place, and time.  Ambulatory with steady gait without assistance  UE and LE with intact strength and sensation   Skin: Skin is warm and dry.  Psychiatric: She has a normal mood and affect. Her behavior is normal.  Nursing note and vitals reviewed.  Filed Vitals:   03/05/16 2224 03/05/16 2254 03/05/16 2301  BP: 157/80 144/94   Pulse: 113 105   Temp: 97.4 F (36.3 C) 98 F (36.7 C) 98.3 F (36.8 C)  TempSrc: Oral Oral Oral  Resp: 16 18   SpO2: 99% 97%      ED Course  Procedures (including critical care time)  DIAGNOSTIC STUDIES: Oxygen Saturation is 99% on  RA, normal by my interpretation.    COORDINATION OF CARE: 10:40 PM Will administer Sublimaze injection 50 mcg, Toradol 30 mg injection and Valium 5 mg injection. Discussed treatment plan with pt at bedside and pt agreed to plan.   Labs Review Labs Reviewed - No data to display  Imaging Review No results found.    EKG Interpretation None      MDM   Final diagnoses:  Bilateral low back pain without sciatica    Pt seen recently for same. 3+ weeks of low back pain that does not radiate. Diffuse low back tenderness. Neurologically intact. No red flags for cauda equina other spinal cord pathology. No new injury or trauma. Pain improved with toradol, fentanyl, and valium. Rx given for short course of norco and naproxen.  Discussed at length that pt must see PCP for f/u and ongoing pain management. SHe verbalized her understanding and agreement.   I personally performed the services described in this documentation, which was scribed in my presence. The recorded information has been reviewed and is accurate.     Carlene CoriaSerena Y Sheddrick Lattanzio, PA-C 03/06/16 0827  Courteney Randall AnLyn Mackuen, MD 03/06/16 [phone]

## 2016-03-05 NOTE — Discharge Instructions (Signed)
You were seen in the ER today for evaluation of low back pain. We gave you pain medicine here and I will give you prescriptions for pain medicine as well. As we discussed, it is important that you follow up with your primary care provider as soon as possible for ongoing evaluation and management of your pain.  Return to the ER for new or worsening symptoms.

## 2016-03-07 ENCOUNTER — Encounter (HOSPITAL_COMMUNITY): Payer: Self-pay

## 2016-03-07 ENCOUNTER — Emergency Department (HOSPITAL_COMMUNITY)
Admission: EM | Admit: 2016-03-07 | Discharge: 2016-03-08 | Disposition: A | Discharge status: Home / Self Care | Payer: Medicaid Other | Attending: Emergency Medicine | Admitting: Emergency Medicine

## 2016-03-07 DIAGNOSIS — Z0289 Encounter for other administrative examinations: Secondary | ICD-10-CM | POA: Insufficient documentation

## 2016-03-07 DIAGNOSIS — Z7984 Long term (current) use of oral hypoglycemic drugs: Secondary | ICD-10-CM | POA: Insufficient documentation

## 2016-03-07 DIAGNOSIS — F250 Schizoaffective disorder, bipolar type: Secondary | ICD-10-CM

## 2016-03-07 DIAGNOSIS — Z791 Long term (current) use of non-steroidal anti-inflammatories (NSAID): Secondary | ICD-10-CM | POA: Insufficient documentation

## 2016-03-07 DIAGNOSIS — G8929 Other chronic pain: Secondary | ICD-10-CM | POA: Diagnosis not present

## 2016-03-07 DIAGNOSIS — Z79899 Other long term (current) drug therapy: Secondary | ICD-10-CM | POA: Insufficient documentation

## 2016-03-07 DIAGNOSIS — F419 Anxiety disorder, unspecified: Secondary | ICD-10-CM | POA: Insufficient documentation

## 2016-03-07 DIAGNOSIS — I10 Essential (primary) hypertension: Secondary | ICD-10-CM | POA: Diagnosis not present

## 2016-03-07 DIAGNOSIS — F1110 Opioid abuse, uncomplicated: Secondary | ICD-10-CM | POA: Diagnosis not present

## 2016-03-07 DIAGNOSIS — F17200 Nicotine dependence, unspecified, uncomplicated: Secondary | ICD-10-CM | POA: Insufficient documentation

## 2016-03-07 DIAGNOSIS — M1990 Unspecified osteoarthritis, unspecified site: Secondary | ICD-10-CM | POA: Insufficient documentation

## 2016-03-07 DIAGNOSIS — R4689 Other symptoms and signs involving appearance and behavior: Secondary | ICD-10-CM

## 2016-03-07 DIAGNOSIS — F6089 Other specific personality disorders: Secondary | ICD-10-CM | POA: Diagnosis not present

## 2016-03-07 DIAGNOSIS — Z046 Encounter for general psychiatric examination, requested by authority: Secondary | ICD-10-CM

## 2016-03-07 DIAGNOSIS — E1165 Type 2 diabetes mellitus with hyperglycemia: Secondary | ICD-10-CM | POA: Diagnosis not present

## 2016-03-07 DIAGNOSIS — F911 Conduct disorder, childhood-onset type: Secondary | ICD-10-CM | POA: Diagnosis present

## 2016-03-07 LAB — CBC WITH DIFFERENTIAL/PLATELET
Basophils Absolute: 0.1 10*3/uL (ref 0.0–0.1)
Basophils Relative: 1 %
Eosinophils Absolute: 0.4 10*3/uL (ref 0.0–0.7)
Eosinophils Relative: 3 %
HCT: 41.8 % (ref 36.0–46.0)
HEMOGLOBIN: 13.2 g/dL (ref 12.0–15.0)
LYMPHS ABS: 4.7 10*3/uL — AB (ref 0.7–4.0)
LYMPHS PCT: 31 %
MCH: 29.1 pg (ref 26.0–34.0)
MCHC: 31.6 g/dL (ref 30.0–36.0)
MCV: 92.3 fL (ref 78.0–100.0)
MONOS PCT: 8 %
Monocytes Absolute: 1.2 10*3/uL — ABNORMAL HIGH (ref 0.1–1.0)
NEUTROS ABS: 8.6 10*3/uL — AB (ref 1.7–7.7)
NEUTROS PCT: 57 %
Platelets: 344 10*3/uL (ref 150–400)
RBC: 4.53 MIL/uL (ref 3.87–5.11)
RDW: 14.3 % (ref 11.5–15.5)
WBC: 15 10*3/uL — ABNORMAL HIGH (ref 4.0–10.5)

## 2016-03-07 LAB — COMPREHENSIVE METABOLIC PANEL
ALBUMIN: 4.2 g/dL (ref 3.5–5.0)
ALK PHOS: 93 U/L (ref 38–126)
ALT: 26 U/L (ref 14–54)
ANION GAP: 10 (ref 5–15)
AST: 26 U/L (ref 15–41)
BUN: 10 mg/dL (ref 6–20)
CO2: 27 mmol/L (ref 22–32)
Calcium: 8.9 mg/dL (ref 8.9–10.3)
Chloride: 104 mmol/L (ref 101–111)
Creatinine, Ser: 0.7 mg/dL (ref 0.44–1.00)
GFR calc Af Amer: >60 mL/min (ref >60)
GFR calc non Af Amer: >60 mL/min (ref >60)
GLUCOSE: 154 mg/dL — AB (ref 65–99)
POTASSIUM: 4.6 mmol/L (ref 3.5–5.1)
SODIUM: 141 mmol/L (ref 135–145)
Total Bilirubin: 0.3 mg/dL (ref 0.3–1.2)
Total Protein: 7.1 g/dL (ref 6.5–8.1)

## 2016-03-07 LAB — SALICYLATE LEVEL

## 2016-03-07 LAB — RAPID URINE DRUG SCREEN, HOSP PERFORMED
Amphetamines: NOT DETECTED
Barbiturates: NOT DETECTED
Benzodiazepines: NOT DETECTED
COCAINE: NOT DETECTED
Opiates: POSITIVE — AB
TETRAHYDROCANNABINOL: NOT DETECTED

## 2016-03-07 LAB — ETHANOL: Alcohol, Ethyl (B): <5 mg/dL (ref <5)

## 2016-03-07 LAB — ACETAMINOPHEN LEVEL

## 2016-03-07 LAB — TSH: TSH: 3.536 u[IU]/mL (ref 0.350–4.500)

## 2016-03-07 MED ORDER — FLUPHENAZINE HCL 5 MG PO TABS
5.0000 mg | ORAL_TABLET | Freq: Every day | ORAL | Status: DC
  Administered 2016-03-08: 5 mg via ORAL
  Filled 2016-03-07: qty 1

## 2016-03-07 MED ORDER — BENZTROPINE MESYLATE 1 MG PO TABS
1.0000 mg | ORAL_TABLET | Freq: Two times a day (BID) | ORAL | Status: DC
  Administered 2016-03-07 – 2016-03-08 (×2): 1 mg via ORAL
  Filled 2016-03-07 (×2): qty 1

## 2016-03-07 MED ORDER — GLIPIZIDE 10 MG PO TABS
10.0000 mg | ORAL_TABLET | Freq: Every day | ORAL | Status: DC
  Administered 2016-03-08: 10 mg via ORAL
  Filled 2016-03-07 (×2): qty 1

## 2016-03-07 MED ORDER — LEVOTHYROXINE SODIUM 25 MCG PO TABS
25.0000 ug | ORAL_TABLET | Freq: Every day | ORAL | Status: DC
  Administered 2016-03-08: 25 ug via ORAL
  Filled 2016-03-07 (×2): qty 1

## 2016-03-07 MED ORDER — GABAPENTIN 300 MG PO CAPS
300.0000 mg | ORAL_CAPSULE | Freq: Three times a day (TID) | ORAL | Status: DC
  Administered 2016-03-07 – 2016-03-08 (×4): 300 mg via ORAL
  Filled 2016-03-07 (×4): qty 1

## 2016-03-07 MED ORDER — PERPHENAZINE 8 MG PO TABS
8.0000 mg | ORAL_TABLET | Freq: Two times a day (BID) | ORAL | Status: DC
  Administered 2016-03-07 – 2016-03-08 (×2): 8 mg via ORAL
  Filled 2016-03-07 (×4): qty 1

## 2016-03-07 MED ORDER — ATORVASTATIN CALCIUM 80 MG PO TABS
80.0000 mg | ORAL_TABLET | Freq: Every day | ORAL | Status: DC
  Administered 2016-03-07: 80 mg via ORAL
  Filled 2016-03-07 (×2): qty 1

## 2016-03-07 MED ORDER — PANTOPRAZOLE SODIUM 40 MG PO TBEC
40.0000 mg | DELAYED_RELEASE_TABLET | Freq: Two times a day (BID) | ORAL | Status: DC
  Administered 2016-03-07 – 2016-03-08 (×2): 40 mg via ORAL
  Filled 2016-03-07 (×2): qty 1

## 2016-03-07 MED ORDER — NICOTINE 21 MG/24HR TD PT24: 21.0000 mg | MEDICATED_PATCH | Freq: Every day | TRANSDERMAL | Status: DC

## 2016-03-07 MED ORDER — IBUPROFEN 200 MG PO TABS
600.0000 mg | ORAL_TABLET | Freq: Three times a day (TID) | ORAL | Status: DC | PRN
  Administered 2016-03-07 – 2016-03-08 (×2): 600 mg via ORAL
  Filled 2016-03-07 (×2): qty 3

## 2016-03-07 MED ORDER — FLUPHENAZINE HCL 5 MG PO TABS
5.0000 mg | ORAL_TABLET | Freq: Every day | ORAL | Status: DC
  Filled 2016-03-07: qty 1

## 2016-03-07 MED ORDER — MIRTAZAPINE 30 MG PO TABS
30.0000 mg | ORAL_TABLET | Freq: Every day | ORAL | Status: DC
  Administered 2016-03-07: 30 mg via ORAL
  Filled 2016-03-07: qty 1

## 2016-03-07 MED ORDER — VITAMIN D3 25 MCG (1000 UNIT) PO TABS
1000.0000 [IU] | ORAL_TABLET | Freq: Every day | ORAL | Status: DC
  Administered 2016-03-08: 1000 [IU] via ORAL
  Filled 2016-03-07: qty 1

## 2016-03-07 MED ORDER — HYDROCODONE-ACETAMINOPHEN 5-325 MG PO TABS
1.0000 | ORAL_TABLET | ORAL | Status: DC | PRN
Start: 2016-03-07 — End: 2016-03-08
  Administered 2016-03-07 – 2016-03-08 (×3): 1 via ORAL
  Filled 2016-03-07 (×3): qty 1

## 2016-03-07 MED ORDER — ALBUTEROL SULFATE HFA 108 (90 BASE) MCG/ACT IN AERS: 2.0000 | INHALATION_SPRAY | RESPIRATORY_TRACT | Status: DC | PRN

## 2016-03-07 MED ORDER — ZOLPIDEM TARTRATE 5 MG PO TABS: 5.0000 mg | ORAL_TABLET | Freq: Every evening | ORAL | Status: DC | PRN

## 2016-03-07 MED ORDER — ESCITALOPRAM OXALATE 10 MG PO TABS
10.0000 mg | ORAL_TABLET | Freq: Every day | ORAL | Status: DC
  Administered 2016-03-08: 10 mg via ORAL
  Filled 2016-03-07: qty 1

## 2016-03-07 MED ORDER — ALUM & MAG HYDROXIDE-SIMETH 200-200-20 MG/5ML PO SUSP: 30.0000 mL | ORAL | Status: DC | PRN

## 2016-03-07 MED ORDER — ONDANSETRON HCL 4 MG PO TABS: 4.0000 mg | ORAL_TABLET | Freq: Three times a day (TID) | ORAL | Status: DC | PRN

## 2016-03-07 MED ORDER — ACETAMINOPHEN 325 MG PO TABS: 650.0000 mg | ORAL_TABLET | ORAL | Status: DC | PRN

## 2016-03-07 MED ORDER — TIOTROPIUM BROMIDE MONOHYDRATE 18 MCG IN CAPS
18.0000 ug | ORAL_CAPSULE | Freq: Every day | RESPIRATORY_TRACT | Status: DC
Start: 2016-03-08 — End: 2016-03-08
  Filled 2016-03-07: qty 5

## 2016-03-07 MED ORDER — LORAZEPAM 1 MG PO TABS: 1.0000 mg | ORAL_TABLET | Freq: Three times a day (TID) | ORAL | Status: DC | PRN

## 2016-03-07 MED ORDER — METFORMIN HCL 500 MG PO TABS
1000.0000 mg | ORAL_TABLET | Freq: Two times a day (BID) | ORAL | Status: DC
  Administered 2016-03-07 – 2016-03-08 (×2): 1000 mg via ORAL
  Filled 2016-03-07 (×5): qty 2

## 2016-03-07 NOTE — BH Assessment (Addendum)
Assessment Note  Carrie Carlson is an 45 y.o. female. Patient presents to the ED brought in by [facility] from [facility] assisted living facility where she resides. Patient was IVC'd for aggressive behavior. The IVC paperwork states that she is "A danger to self and others". Diagnosed with schizoaffective disorder and bipolar disorder. She also has diabetes and disease of the thyroid. States that she will hurt anyone who gets in her way; has thrown TV at [employer]; takes other residents pain meds; will not take her prescribed meds and refuses to see medical professionals."   Patient denies all of these allegations, stating that she has not been aggressive to anyone at the facility. She states she doesn't know why she is here. She denies any HI/SI/AVH. Patient denies alcohol and drug use. Patient's spouse at bedside also denies that patient has exhibited such behaviors. Patient denies history of inpatient hospitalizations. She also reports that she is compliant with all medications.   Collateral Information:  Contacted [facility] Worker "Lurena Joiner" for collateral information. Sts that patient has lived at the facility 1 yr and 4 months. Patient has since displayed various inappropriate behaviors. Patient is reportedly defecating and urinating on herself. Patient has not endorsed any suicidal thoughts or has a history of suicidal attempts/gestures. However, [facility] staff believe that patient is a danger to herself because patient is soliciting medications (pain medications) from other residents. Patient offers other residents $5 to cheek their medications so that they can give to her. Patient also obtains money from a unknown source. [facility] Staff is unable to locate the financial source. Patient also has tested positive 2x's for cocaine since living at the facility. Staff have witnessed patient obtaining crack cocaine from a local cab driver. Patient becomes angry when maintenance tries to clean her  room. Patient tried to throw her television at the maintenance worker last summer. Patient has also refused to see the facilities medical providers, including psychiatrist. Patient refuses medications offered to her by the facility with the exception of her pain medications.      Diagnosis:  Bipolar Disorder, Schizoaffective Disorder, Anxiety  Past Medical History:  Past Medical History  Diagnosis Date  . Osteoarthritis   . Back pain   . Anxiety   . Diabetes mellitus without complication (HCC)     History reviewed. No pertinent past surgical history.  Family History: History reviewed. No pertinent family history.  Social History:  reports that she has been smoking.  She does not have any smokeless tobacco history on file. She reports that she does not drink alcohol or use illicit drugs.  Additional Social History:  Alcohol / Drug Use Pain Medications: SEE MAR Prescriptions: SEE MAR Over the Counter: SEE MAR History of alcohol / drug use?: No history of alcohol / drug abuse  CIWA: CIWA-Ar BP: (!) 163/110 mmHg Pulse Rate: 97 COWS:    Allergies: No Known Allergies  Home Medications:  (Not in a hospital admission)  OB/GYN Status:  No LMP recorded. Patient is postmenopausal.  General Assessment Data Location of Assessment: [facility] ED Is this a Tele or Face-to-Face Assessment?: Face-to-Face Is this an Initial Assessment or a Re-assessment for this encounter?: Initial Assessment Marital status: Single Maiden name:  (n/a) Is patient pregnant?: No Pregnancy Status: No Can pt return to current living arrangement?: No Admission Status: Involuntary Is patient capable of signing voluntary admission?: No Referral Source: Self/Family/Friend Insurance type:  (Medicaid )     Crisis Care Plan Legal Guardian: Other: (  no legal guardian ) Name of Psychiatrist:  (No psychiatrist ) Name of Therapist:  (No therapist)  Education Status Is patient currently in school?: No Current  Grade:  (n/a) Highest grade of school patient has completed:  (n/a) Name of school:  (n/a) Contact person:  (n/a)  Risk to self with the past 6 months Suicidal Ideation: No Has patient been a risk to self within the past 6 months prior to admission? : No Suicidal Intent: No Has patient had any suicidal intent within the past 6 months prior to admission? : No Is patient at risk for suicide?: No Suicidal Plan?: No Has patient had any suicidal plan within the past 6 months prior to admission? : No Access to Means: No What has been your use of drugs/alcohol within the last 12 months?:  (n/a) Previous Attempts/Gestures: No How many times?:  (0) Other Self Harm Risks:  (n/a) Triggers for Past Attempts:  (no previous suicide attempts or gestures) Intentional Self Injurious Behavior: None Family Suicide History: No Recent stressful life event(s): Other (Comment), Turmoil (Comment), Trauma (Comment), Recent negative physical changes, Financial Problems, Job Loss, Legal Issues, Loss (Comment), Conflict (Comment), Divorce Persecutory voices/beliefs?: No Depression: Yes Depression Symptoms: Feeling angry/irritable, Feeling worthless/self pity, Loss of interest in usual pleasures, Guilt, Isolating, Fatigue, Tearfulness, Insomnia, Despondent Substance abuse history and/or treatment for substance abuse?: No Suicide prevention information given to non-admitted patients: Not applicable  Risk to Others within the past 6 months Homicidal Ideation: No Does patient have any lifetime risk of violence toward others beyond the six months prior to admission? : No Thoughts of Harm to Others: No Current Homicidal Intent: No Current Homicidal Plan: No Access to Homicidal Means: No Identified Victim:  (n/a) History of harm to others?: No Assessment of Violence: None Noted Violent Behavior Description:  (patient is calm and cooperative ) Does patient have access to weapons?: No Criminal Charges Pending?:  No Does patient have a court date: No Is patient on probation?: No  Psychosis Hallucinations: None noted Delusions: None noted  Mental Status Report Appearance/Hygiene: Disheveled Eye Contact: Good Motor Activity: Freedom of movement Speech: Logical/coherent Level of Consciousness: Alert Mood: Depressed Affect: Appropriate to circumstance Anxiety Level: Severe Thought Processes: Relevant, Coherent Judgement: Impaired Orientation: Person, Place, Time, Situation Obsessive Compulsive Thoughts/Behaviors: None  Cognitive Functioning Concentration: Decreased Memory: Recent Intact, Remote Intact IQ: Average Insight: Fair Impulse Control: Fair Appetite: Good Weight Loss:  (none reported) Weight Gain:  ("few pounds in the past 2-3 months") Sleep:  (varies; patient doesnt know exact amount) Total Hours of Sleep:  (varies ) Vegetative Symptoms: None  ADLScreening [facility] Patient's cognitive ability adequate to safely complete daily activities?: Yes Patient able to express need for assistance with ADLs?: Yes Independently performs ADLs?: Yes (appropriate for developmental age)  Prior Inpatient Therapy Prior Inpatient Therapy: Yes Prior Therapy Dates:  (n/a) Prior Therapy Facilty/Provider(s):  (n/a) Reason for Treatment:  (n/a)  Prior Outpatient Therapy Prior Outpatient Therapy: No Prior Therapy Dates:  (n/a) Prior Therapy Facilty/Provider(s):  (n/a) Reason for Treatment:  (n/a) Does patient have an ACCT team?: No Does patient have Intensive In-House Services?  : No Does patient have [facility] services? : No Does patient have P4CC services?: No  ADL Screening (condition at time of admission) Patient's cognitive ability adequate to safely complete daily activities?: Yes Is the patient deaf or have difficulty hearing?: No Does the patient have difficulty seeing, even when wearing glasses/contacts?: No Does the patient have difficulty concentrating,  remembering, or  making decisions?: No Patient able to express need for assistance with ADLs?: Yes Does the patient have difficulty dressing or bathing?: No Independently performs ADLs?: Yes (appropriate for developmental age) Communication: Independent Dressing (OT): Independent Grooming: Independent Feeding: Independent Bathing: Independent Toileting: Independent In/Out Bed: Independent Walks in Home: Independent Does the patient have difficulty walking or climbing stairs?: No Weakness of Legs: None Weakness of Arms/Hands: None  Home Assistive Devices/Equipment Home Assistive Devices/Equipment: None    Abuse/Neglect Assessment (Assessment to be complete while patient is alone) Physical Abuse: Denies Verbal Abuse: Denies Sexual Abuse: Denies Exploitation of patient/patient's resources: Denies Self-Neglect: Denies Values / Beliefs Cultural Requests During Hospitalization: None Spiritual Requests During Hospitalization: None   Advance Directives (For Healthcare) Does patient have an advance directive?: No Would patient like information on creating an advanced directive?: No - patient declined information    Additional Information 1:1 In Past 12 Months?: No CIRT Risk: No Elopement Risk: No Does patient have medical clearance?: Yes     Disposition:  Disposition Initial Assessment Completed for this Encounter: Yes  On Site Evaluation by:   Reviewed with Physician:  Per Julieanne Cotton, NP patient to remain in the ED overnight for observation. Patient to re-evaluated by psychiatry in the morning.   Melynda Ripple [facility] 03/07/2016 1:40 PM

## 2016-03-07 NOTE — ED Provider Notes (Signed)
CSN: [id number]     Arrival date & time 03/07/16  1033 History   First MD Initiated Contact with Patient 03/07/16 1115     Chief Complaint  Patient presents with  . Aggressive Behavior     (Consider location/radiation/quality/duration/timing/severity/associated sxs/prior Treatment) HPI Comments: Carrie Carlson is a 45 y.o. female with a PMHx of chronic back pain, anxiety, schizoaffective disorder, bipolar disorder, hypothyroidism, and DM2, who [facility], IVC'd for aggressive behavior. IVC paperwork states that she is "a danger to self and others, to wit: diagnosed with schizoaffective disorder and bipolar disorder; also has diabetes and disease of the thyroid; states that she will hurt anyone who gets in her way; has thrown TV at maintenance worker; takes other residents pain meds; will not take her prescribed meds and refuses to see medical professionals." She denies any of these allegations, stating that she has not been aggressive to anybody that she has been smoking a cigarette outside of her living facility since 4 AM. She states she doesn't know why she is here. She denies any HI/SI/AVH, was a drug use, or alcohol use. She endorses being a daily regular smoker. She has chronic back pain but otherwise has no medical complaints. She states that she takes all her medications every day, reporting that she took them at 8 AM, but she cannot recall the names of her medications. She states she thinks she is on psychiatric medications but can't remember specifically what they are.  Patient is a 45 y.o. female presenting with mental health disorder. The history is provided by the patient and medical records. No language interpreter was used.  Mental Health Problem Presenting symptoms: aggressive behavior and agitation (per IVC paperwork)   Presenting symptoms: no hallucinations and no suicidal thoughts   Patient  accompanied by:  Law enforcement Onset quality:  Unable to specify Timing:  Unable to specify Progression:  Unable to specify Chronicity:  Recurrent Context: noncompliance (per IVC paperwork)   Treatment compliance:  Untreated (per IVC paperwork, although she states she took her meds at 8am) Relieved by:  None tried Worsened by:  Nothing tried Ineffective treatments:  None tried Associated symptoms: no abdominal pain and no chest pain   Risk factors: hx of mental illness     Past Medical History  Diagnosis Date  . Osteoarthritis   . Back pain   . Anxiety   . Diabetes mellitus without complication (HCC)    History reviewed. No pertinent past surgical history. History reviewed. No pertinent family history. Social History  Substance Use Topics  . Smoking status: Current Every Day Smoker  . Smokeless tobacco: None  . Alcohol Use: No   OB History    No data available     Review of Systems  Constitutional: Negative for fever and chills.  Respiratory: Negative for shortness of breath.   Cardiovascular: Negative for chest pain.  Gastrointestinal: Negative for nausea, vomiting, abdominal pain, diarrhea and constipation.  Genitourinary: Negative for dysuria and hematuria.  Musculoskeletal: Positive for back pain (chronic and unchanged). Negative for myalgias and arthralgias.  Skin: Negative for color change.  Allergic/Immunologic: Positive for immunocompromised state (diabetic).  Neurological: Negative for weakness and numbness.  Psychiatric/Behavioral: Positive for agitation (per IVC paperwork). Negative for suicidal ideas, hallucinations and confusion.   10 Systems reviewed and are negative for acute change except as noted in the HPI.    Allergies  Review of patient's  allergies indicates no known allergies.  Home Medications   Prior to Admission medications   Medication Sig Start Date End Date Taking? Authorizing Provider  acetaminophen (TYLENOL) 325 MG tablet Take 650  mg by mouth every 6 (six) hours as needed for moderate pain.    Historical Provider, MD  albuterol (PROVENTIL HFA;VENTOLIN HFA) 108 (90 Base) MCG/ACT inhaler Inhale 2 puffs into the lungs every 6 (six) hours as needed for wheezing or shortness of breath.    Historical Provider, MD  atorvastatin (LIPITOR) 80 MG tablet Take 80 mg by mouth at bedtime.    Historical Provider, MD  benztropine (COGENTIN) 1 MG tablet Take 1 mg by mouth 2 (two) times daily.    Historical Provider, MD  cholecalciferol (VITAMIN D) 1000 UNITS tablet Take 1,000 Units by mouth daily.    Historical Provider, MD  escitalopram (LEXAPRO) 10 MG tablet Take 10 mg by mouth daily.    Historical Provider, MD  fluPHENAZine (PROLIXIN) 5 MG/ML solution Take 5 mg by mouth at bedtime.    Historical Provider, MD  gabapentin (NEURONTIN) 300 MG capsule Take 300 mg by mouth 3 (three) times daily.    Historical Provider, MD  glipiZIDE (GLUCOTROL) 10 MG tablet Take 10 mg by mouth daily before breakfast.    Historical Provider, MD  HYDROcodone-acetaminophen (NORCO/VICODIN) 5-325 MG tablet Take 1 tablet by mouth every 4 (four) hours as needed. 03/05/16   Ace Gins Sam, PA-C  levothyroxine (SYNTHROID, LEVOTHROID) 25 MCG tablet Take 25 mcg by mouth daily before breakfast.    Historical Provider, MD  lidocaine (LIDODERM) 5 % Place 1 patch onto the skin daily. Remove & Discard patch within 12 hours or as directed by MD Patient not taking: Reported on 01/04/2016 06/12/15   Arthor Captain, PA-C  Melatonin 1 MG TABS Take 1 tablet by mouth at bedtime.    Historical Provider, MD  metFORMIN (GLUCOPHAGE) 1000 MG tablet Take 1,000 mg by mouth 2 (two) times daily with a meal.     Historical Provider, MD  mirtazapine (REMERON) 30 MG tablet Take 30 mg by mouth at bedtime.    Historical Provider, MD  naproxen (NAPROSYN) 500 MG tablet Take 1 tablet (500 mg total) by mouth 2 (two) times daily. 02/26/16   Antony Madura, PA-C  naproxen (NAPROSYN) 500 MG tablet Take 1 tablet (500  mg total) by mouth 2 (two) times daily. 03/05/16   Ace Gins Sam, PA-C  ondansetron (ZOFRAN) 4 MG tablet Take 4 mg by mouth every 6 (six) hours as needed for nausea or vomiting.    Historical Provider, MD  oxyCODONE-acetaminophen (PERCOCET/ROXICET) 5-325 MG tablet Take 1 tablet by mouth every 6 (six) hours as needed for severe pain. 02/26/16   Antony Madura, PA-C  pantoprazole (PROTONIX) 40 MG tablet Take 40 mg by mouth 2 (two) times daily.    Historical Provider, MD  perphenazine (TRILAFON) 8 MG tablet Take 8 mg by mouth 2 (two) times daily.    Historical Provider, MD  tiotropium (SPIRIVA) 18 MCG inhalation capsule Place 18 mcg into inhaler and inhale daily.    Historical Provider, MD   BP 163/110 mmHg  Pulse 97  Temp(Src) 98.4 F (36.9 C) (Oral)  Resp 19  SpO2 97% Physical Exam  Constitutional: She is oriented to person, place, and time. Vital signs are normal. She appears well-developed and well-nourished.  Non-toxic appearance. No distress.  Afebrile, nontoxic, NAD  HENT:  Head: Normocephalic and atraumatic.  Mouth/Throat: Oropharynx is clear and moist and mucous membranes  are normal.  Eyes: Conjunctivae and EOM are normal. Right eye exhibits no discharge. Left eye exhibits no discharge.  Neck: Normal range of motion. Neck supple.  Cardiovascular: Normal rate, regular rhythm, normal heart sounds and intact distal pulses.  Exam reveals no gallop and no friction rub.   No murmur heard. Pulmonary/Chest: Effort normal and breath sounds normal. No respiratory distress. She has no decreased breath sounds. She has no wheezes. She has no rhonchi. She has no rales.  Abdominal: Soft. Normal appearance and bowel sounds are normal. She exhibits no distension. There is no tenderness. There is no rigidity, no rebound, no guarding, no CVA tenderness, no tenderness at McBurney's point and negative Murphy's sign.  Musculoskeletal: Normal range of motion.  Neurological: She is alert and oriented to person,  place, and time. She has normal strength. No sensory deficit.  Skin: Skin is warm, dry and intact. No rash noted.  Psychiatric: She has a normal mood and affect. Her speech is normal. She is not actively hallucinating. She expresses no homicidal and no suicidal ideation. She expresses no suicidal plans and no homicidal plans.  Pleasant and cooperative stating that she doesn't know why she's here, and denies the allegations listed on the IVC paperwork. Denies SI/HI/AVH.  Nursing note and vitals reviewed.   ED Course  Procedures (including critical care time) Labs Review Labs Reviewed  COMPREHENSIVE METABOLIC PANEL - Abnormal; Notable for the following:    Glucose, Bld 154 (*)    All other components within normal limits  CBC WITH DIFFERENTIAL/PLATELET - Abnormal; Notable for the following:    WBC 15.0 (*)    Neutro Abs 8.6 (*)    Lymphs Abs 4.7 (*)    Monocytes Absolute 1.2 (*)    All other components within normal limits  ACETAMINOPHEN LEVEL - Abnormal; Notable for the following:    Acetaminophen (Tylenol), Serum <10 (*)    All other components within normal limits  ETHANOL  SALICYLATE LEVEL  TSH  URINE RAPID DRUG SCREEN, HOSP PERFORMED    Imaging Review No results found. I have personally reviewed and evaluated these images and lab results as part of my medical decision-making.   EKG Interpretation None      MDM   Final diagnoses:  Schizoaffective disorder, bipolar type (HCC)  Aggressive behavior  Involuntary commitment  Essential hypertension  Type 2 diabetes mellitus with hyperglycemia, unspecified long term insulin use status (HCC)    45 y.o. female here with aggressive behavior and IVC'd. Paperwork stating she is "a danger to self and others, to wit: diagnosed with schizoaffective disorder and bipolar disorder; also has diabetes and disease of the thyroid; states that she will hurt anyone who gets in her way; has thrown TV at [employer]; takes other  residents pain meds; will not take her prescribed meds and refuses to see medical professionals." Pt denies this, denies SI/HI/AVH. States she took her meds at 8am. Will get screening labs and TTS consultation once labs are done. Will also check TSH  12:33 PM CMP showing gluc 154 with no anion gap. EtOH neg. CBC showing chronic leukocytosis, no acute changes. Salicylate and acetaminophen WNL. TSH pending, and UDS not yet obtained, but overall I feel she is medically cleared. Will proceed with TTS consult and psych hold orders  1:01 PM TSH WNL, medically cleared. Holding orders in place and home meds reordered. Please see TTS consult notes for further documentation of care/dispo.  BP 163/110 mmHg  Pulse 97  Temp(Src) 98.4  F (36.9 C) (Oral)  Resp 19  SpO2 97%  Meds ordered this encounter  Medications  . alum & mag hydroxide-simeth (MAALOX/MYLANTA) 200-200-20 MG/5ML suspension 30 mL    Sig:   . ondansetron (ZOFRAN) tablet 4 mg    Sig:   . nicotine (NICODERM CQ - dosed in mg/24 hours) patch 21 mg    Sig:   . DISCONTD: zolpidem (AMBIEN) tablet 5 mg    Sig:   . ibuprofen (ADVIL,MOTRIN) tablet 600 mg    Sig:   . acetaminophen (TYLENOL) tablet 650 mg    Sig:   . LORazepam (ATIVAN) tablet 1 mg    Sig:   . albuterol (PROVENTIL HFA;VENTOLIN HFA) 108 (90 Base) MCG/ACT inhaler 2 puff    Sig:   . atorvastatin (LIPITOR) tablet 80 mg    Sig:   . benztropine (COGENTIN) tablet 1 mg    Sig:   . cholecalciferol (VITAMIN D) tablet 1,000 Units    Sig:   . escitalopram (LEXAPRO) tablet 10 mg    Sig:   . fluPHENAZine (PROLIXIN) 5 MG/ML solution 5 mg    Sig:   . gabapentin (NEURONTIN) capsule 300 mg    Sig:   . glipiZIDE (GLUCOTROL) tablet 10 mg    Sig:   . HYDROcodone-acetaminophen (NORCO/VICODIN) 5-325 MG per tablet 1 tablet    Sig:   . levothyroxine (SYNTHROID, LEVOTHROID) tablet 25 mcg    Sig:   . metFORMIN (GLUCOPHAGE) tablet 1,000 mg    Sig:   . mirtazapine (REMERON) tablet 30 mg     Sig:   . pantoprazole (PROTONIX) EC tablet 40 mg    Sig:   . perphenazine (TRILAFON) tablet 8 mg    Sig:   . tiotropium (SPIRIVA) inhalation capsule 18 mcg    Sig:      Jocelynn Gioffre Camprubi-Soms, PA-C 03/07/16 1302  Derwood Kaplan, MD 03/08/16 [phone]

## 2016-03-07 NOTE — Progress Notes (Signed)
[facility] is [facility] [address] [place] [id number] [phone] Herma MeringSadou Ibrahim

## 2016-03-07 NOTE — ED Notes (Signed)
Pt here by [facility].  IVC by [facility]. Pt threw a tv at a Consulting civil engineermaintenance worker.  Pt threatening anyone who gets in her way.  Pt taking other residents meds.  Pt calm/cooperative at this time.  Pt has schizo dx

## 2016-03-07 NOTE — BH Assessment (Signed)
Collateral Information:   Contacted [facility] Worker "Lurena JoinerRebecca" for collateral information. Sts that patient has lived at the facility 1 yr and 4 months. Patient has since displayed various inappropriate behaviors. Patient is reportedly defecating and urinating on herself. Patient has not endorsed any suicidal thoughts or has a history of suicidal attempts/gestures. However, [facility] staff believe that patient is a danger to herself because patient is soliciting medications (pain medications) from other residents. Patient offers other residents $5 to cheek their medications so that they can give to her. Patient also obtains money from a unknown source. [facility] Staff is unable to locate the financial source. Patient also has tested positive 2x's for cocaine since living at the facility. Staff have witnessed patient obtaining crack cocaine from a local cab driver. Patient becomes angry when maintenance tries to clean her room. Patient tried to throw her television at the maintenance worker last summer. Patient has also refused to see the facilities medical providers, including psychiatrist. Patient refuses medications offered to her by the facility with the exception of her pain medications.

## 2016-03-07 NOTE — ED Notes (Signed)
Pt admitted to room #41. No insight. "I don't know why I'm here, you tell me.?" "I still don't know why they do this to me." Pt denies SI/HI. Denies AVH. Poor eye contact noted during conversation, but cooperative on approach. Pt denies alcohol and substance abuse. Pt denies hx of falls, denies hx of aggression. Pt presents with paranoia. "They have on my chart I'm bipolar and schizophrenic, but I have never been diagnosed." Pt also reports"They have done this before, false accusations, can't they be sued for that?" Pt reports she sees a psychiatrist and [facility]. Special checks q 15 mins in place for safety. Will continue to monitor.

## 2016-03-08 DIAGNOSIS — R4689 Other symptoms and signs involving appearance and behavior: Secondary | ICD-10-CM | POA: Insufficient documentation

## 2016-03-08 DIAGNOSIS — F250 Schizoaffective disorder, bipolar type: Secondary | ICD-10-CM | POA: Diagnosis not present

## 2016-03-08 DIAGNOSIS — F6089 Other specific personality disorders: Secondary | ICD-10-CM | POA: Diagnosis not present

## 2016-03-08 NOTE — ED Notes (Signed)
Pt had been in room resting for most of the evening. Pt at the time of assessment denies depression, anxiety, SI, HI, and AVH. Pt states, "I am doing just fine." Pt Instructed to come to RN with problems or concerns. Will continue to monitor for safety via security cameras and Q 15 minute checks. Support, encouragement, and safe environment provided.

## 2016-03-08 NOTE — ED Notes (Signed)
This nurse called [facility], reported that no transportation was available at this time.

## 2016-03-08 NOTE — ED Notes (Signed)
This nurse reviewed AVS with pt. Pt verbalizes understanding of discharge summary. Pt denies SI/HI. Denies AVH at discharge.

## 2016-03-08 NOTE — ED Notes (Signed)
Pt discharged from facility per MD order. [facility]. [facility] at facility to transport pt. Pt signed for personal belongings and personal belongings given to [facility]. Pt ambulatory out of facility.

## 2016-03-08 NOTE — ED Notes (Signed)
Spoke with Child psychotherapistsocial worker; Lavonia . Reports [facility] is [facility], also  Reports she left message with pt Legal Guardian

## 2016-03-08 NOTE — Progress Notes (Addendum)
Per request of Psychiatrist and NP, CSW called [facility] and spoke with Lurena JoinerRebecca, Resident Care Director [phone] to inform her that patient was cleared for discharge. Resident Care Director stated patient can return to their facility now and it does not have to be a certain time frame.   CSW left a message for Lind Covertmelia Patel, Guardian, [place] [place] [phone] to inform her that [facility]. CSW left name and contact number.   Elenore PaddyLaVonia Macari Zalesky, LCSWA [phone] ED CSW 03/08/2016 11:37 AM

## 2016-03-08 NOTE — Consult Note (Signed)
[facility] Consult   Reason for Consult:  Anger, Medication non compliant Referring Physician: EDP Patient Identification: Carrie Carlson MRN:  [id number] Principal Diagnosis: Schizoaffective disorder, bipolar type [facility]) Diagnosis:   Patient Active Problem List   Diagnosis Date Noted  . Schizoaffective disorder, bipolar type ([place]) [F25.0] 04/26/2015    Priority: Low  . Laceration of right wrist [S61.511A]     Total Time spent with patient: 45 minutes  Subjective:   Carrie Carlson is a 45 y.o. female patient admitted with  Anger, Medication non compliant  HPI:  Caucasian female, 45 years old was evaluated brought in from her assisted living facility under IVC.  Today states that she was surprised when the Police came and brought her to the hospital.  She states she has not had any problem with residents or staff members in the past 1 year.  Patient admits that she took Tramadol from another resident 3 times and paid for it.  Patient is remorseful and promises not to take medications from another resident.  She vehemently denies using Cocaine in the facility.  Patient denies SI/HI/AVH.  Patient has agreed to take her medications and is discharged back to the facility.  Past Psychiatric History: Schizoaffective disorder, Bipolar disorder.  Risk to Self: Suicidal Ideation: No Suicidal Intent: No Is patient at risk for suicide?: No Suicidal Plan?: No Access to Means: No What has been your use of drugs/alcohol within the last 12 months?:  (n/a) How many times?:  (0) Other Self Harm Risks:  (n/a) Triggers for Past Attempts:  (no previous suicide attempts or gestures) Intentional Self Injurious Behavior: None Risk to Others: Homicidal Ideation: No Thoughts of Harm to Others: No Current Homicidal Intent: No Current Homicidal Plan: No Access to Homicidal Means: No Identified Victim:  (n/a) History of harm to others?: No Assessment of Violence: None Noted Violent  Behavior Description:  (patient is calm and cooperative ) Does patient have access to weapons?: No Criminal Charges Pending?: No Does patient have a court date: No Prior Inpatient Therapy: Prior Inpatient Therapy: Yes Prior Therapy Dates:  (n/a) Prior Therapy Facilty/Provider(s):  (n/a) Reason for Treatment:  (n/a) Prior Outpatient Therapy: Prior Outpatient Therapy: No Prior Therapy Dates:  (n/a) Prior Therapy Facilty/Provider(s):  (n/a) Reason for Treatment:  (n/a) Does patient have an ACCT team?: No Does patient have Intensive In-House Services?  : No Does patient have [facility] services? : No Does patient have P4CC services?: No  Past Medical History:  Past Medical History  Diagnosis Date  . Osteoarthritis   . Back pain   . Anxiety   . Diabetes mellitus without complication ([place])    History reviewed. No pertinent past surgical history. Family History: History reviewed. No pertinent family history.   Family Psychiatric  History: denies Social History:  History  Alcohol Use No     History  Drug Use No    Social History   Social History  . Marital Status: Married    Spouse Name: N/A  . Number of Children: N/A  . Years of Education: N/A   Social History Main Topics  . Smoking status: Current Every Day Smoker  . Smokeless tobacco: None  . Alcohol Use: No  . Drug Use: No  . Sexual Activity: Not Asked   Other Topics Concern  . None   Social History Narrative   Additional Social History:    Allergies:  No Known Allergies  Labs:  Results for orders placed or performed during the  hospital encounter of 03/07/16 (from the past 48 hour(s))  Comprehensive metabolic panel     Status: Abnormal   Collection Time: 03/07/16 11:40 AM  Result Value Ref Range   Sodium 141 135 - 145 mmol/L   Potassium 4.6 3.5 - 5.1 mmol/L   Chloride 104 101 - 111 mmol/L   CO2 27 22 - 32 mmol/L   Glucose, Bld 154 (H) 65 - 99 mg/dL   BUN 10 6 - 20 mg/dL   Creatinine, Ser 0.70 0.44 -  1.00 mg/dL   Calcium 8.9 8.9 - 10.3 mg/dL   Total Protein 7.1 6.5 - 8.1 g/dL   Albumin 4.2 3.5 - 5.0 g/dL   AST 26 15 - 41 U/L   ALT 26 14 - 54 U/L   Alkaline Phosphatase 93 38 - 126 U/L   Total Bilirubin 0.3 0.3 - 1.2 mg/dL   GFR calc non Af Amer >60 >60 mL/min   GFR calc Af Amer >60 >60 mL/min    Comment: (NOTE) The eGFR has been calculated using the CKD EPI equation. This calculation has not been validated in all clinical situations. eGFR's persistently <60 mL/min signify possible Chronic Kidney Disease.    Anion gap 10 5 - 15  Ethanol     Status: None   Collection Time: 03/07/16 11:40 AM  Result Value Ref Range   Alcohol, Ethyl (B) <5 <5 mg/dL    Comment:        LOWEST DETECTABLE LIMIT FOR SERUM ALCOHOL IS 5 mg/dL FOR MEDICAL PURPOSES ONLY   CBC with Diff     Status: Abnormal   Collection Time: 03/07/16 11:40 AM  Result Value Ref Range   WBC 15.0 (H) 4.0 - 10.5 K/uL   RBC 4.53 3.87 - 5.11 MIL/uL   Hemoglobin 13.2 12.0 - 15.0 g/dL   HCT 41.8 36.0 - 46.0 %   MCV 92.3 78.0 - 100.0 fL   MCH 29.1 26.0 - 34.0 pg   MCHC 31.6 30.0 - 36.0 g/dL   RDW 14.3 11.5 - 15.5 %   Platelets 344 150 - 400 K/uL   Neutrophils Relative % 57 %   Neutro Abs 8.6 (H) 1.7 - 7.7 K/uL   Lymphocytes Relative 31 %   Lymphs Abs 4.7 (H) 0.7 - 4.0 K/uL   Monocytes Relative 8 %   Monocytes Absolute 1.2 (H) 0.1 - 1.0 K/uL   Eosinophils Relative 3 %   Eosinophils Absolute 0.4 0.0 - 0.7 K/uL   Basophils Relative 1 %   Basophils Absolute 0.1 0.0 - 0.1 K/uL  Salicylate level     Status: None   Collection Time: 03/07/16 11:40 AM  Result Value Ref Range   Salicylate Lvl <6.4 2.8 - 30.0 mg/dL  Acetaminophen level     Status: Abnormal   Collection Time: 03/07/16 11:40 AM  Result Value Ref Range   Acetaminophen (Tylenol), Serum <10 (L) 10 - 30 ug/mL    Comment:        THERAPEUTIC CONCENTRATIONS VARY SIGNIFICANTLY. A RANGE OF 10-30 ug/mL MAY BE AN EFFECTIVE CONCENTRATION FOR MANY PATIENTS. HOWEVER,  SOME ARE BEST TREATED AT CONCENTRATIONS OUTSIDE THIS RANGE. ACETAMINOPHEN CONCENTRATIONS >150 ug/mL AT 4 HOURS AFTER INGESTION AND >50 ug/mL AT 12 HOURS AFTER INGESTION ARE OFTEN ASSOCIATED WITH TOXIC REACTIONS.   TSH     Status: None   Collection Time: 03/07/16 11:40 AM  Result Value Ref Range   TSH 3.536 0.350 - 4.500 uIU/mL  Urine rapid drug screen (hosp performed)not at  [facility]     Status: Abnormal   Collection Time: 03/07/16 12:03 PM  Result Value Ref Range   Opiates POSITIVE (A) NONE DETECTED   Cocaine NONE DETECTED NONE DETECTED   Benzodiazepines NONE DETECTED NONE DETECTED   Amphetamines NONE DETECTED NONE DETECTED   Tetrahydrocannabinol NONE DETECTED NONE DETECTED   Barbiturates NONE DETECTED NONE DETECTED    Comment:        DRUG SCREEN FOR MEDICAL PURPOSES ONLY.  IF CONFIRMATION IS NEEDED FOR ANY PURPOSE, NOTIFY LAB WITHIN 5 DAYS.        LOWEST DETECTABLE LIMITS FOR URINE DRUG SCREEN Drug Class       Cutoff (ng/mL) Amphetamine      1000 Barbiturate      200 Benzodiazepine   161 Tricyclics       096 Opiates          300 Cocaine          300 THC              50     Current Facility-Administered Medications  Medication Dose Route Frequency Provider Last Rate Last Dose  . acetaminophen (TYLENOL) tablet 650 mg  650 mg Oral Q4H PRN Mercedes Camprubi-Soms, PA-C      . albuterol (PROVENTIL HFA;VENTOLIN HFA) 108 (90 Base) MCG/ACT inhaler 2 puff  2 puff Inhalation Q4H PRN Mercedes Camprubi-Soms, PA-C      . alum & mag hydroxide-simeth (MAALOX/MYLANTA) 200-200-20 MG/5ML suspension 30 mL  30 mL Oral PRN Mercedes Camprubi-Soms, PA-C      . atorvastatin (LIPITOR) tablet 80 mg  80 mg Oral QHS Mercedes Camprubi-Soms, PA-C   80 mg at 03/07/16 2212  . benztropine (COGENTIN) tablet 1 mg  1 mg Oral BID Mercedes Camprubi-Soms, PA-C   1 mg at 03/08/16 0926  . cholecalciferol (VITAMIN D) tablet 1,000 Units  1,000 Units Oral Daily Mercedes Camprubi-Soms, PA-C   1,000 Units at 03/08/16  0926  . escitalopram (LEXAPRO) tablet 10 mg  10 mg Oral Daily Mercedes Camprubi-Soms, PA-C   10 mg at 03/08/16 0927  . fluPHENAZine (PROLIXIN) tablet 5 mg  5 mg Oral Q1200 Ankit Nanavati, MD   5 mg at 03/08/16 1131  . gabapentin (NEURONTIN) capsule 300 mg  300 mg Oral TID Mercedes Camprubi-Soms, PA-C   300 mg at 03/08/16 0926  . glipiZIDE (GLUCOTROL) tablet 10 mg  10 mg Oral QAC breakfast Mercedes Camprubi-Soms, PA-C   10 mg at 03/08/16 0804  . HYDROcodone-acetaminophen (NORCO/VICODIN) 5-325 MG per tablet 1 tablet  1 tablet Oral Q4H PRN Mercedes Camprubi-Soms, PA-C   1 tablet at 03/08/16 1049  . ibuprofen (ADVIL,MOTRIN) tablet 600 mg  600 mg Oral Q8H PRN Mercedes Camprubi-Soms, PA-C   600 mg at 03/08/16 0804  . levothyroxine (SYNTHROID, LEVOTHROID) tablet 25 mcg  25 mcg Oral QAC breakfast Mercedes Camprubi-Soms, PA-C   25 mcg at 03/08/16 0804  . LORazepam (ATIVAN) tablet 1 mg  1 mg Oral Q8H PRN Mercedes Camprubi-Soms, PA-C      . metFORMIN (GLUCOPHAGE) tablet 1,000 mg  1,000 mg Oral BID WC Mercedes Camprubi-Soms, PA-C   1,000 mg at 03/08/16 0803  . mirtazapine (REMERON) tablet 30 mg  30 mg Oral QHS Mercedes Camprubi-Soms, PA-C   30 mg at 03/07/16 2212  . nicotine (NICODERM CQ - dosed in mg/24 hours) patch 21 mg  21 mg Transdermal Daily Mercedes Camprubi-Soms, PA-C   21 mg at 03/07/16 1729  . ondansetron (ZOFRAN) tablet 4 mg  4 mg Oral Q8H  PRN Mercedes Camprubi-Soms, PA-C      . pantoprazole (PROTONIX) EC tablet 40 mg  40 mg Oral BID Mercedes Camprubi-Soms, PA-C   40 mg at 03/08/16 0927  . perphenazine (TRILAFON) tablet 8 mg  8 mg Oral Q12H Mercedes Camprubi-Soms, PA-C   8 mg at 03/08/16 0803  . tiotropium (SPIRIVA) inhalation capsule 18 mcg  18 mcg Inhalation Daily Mercedes Camprubi-Soms, PA-C   18 mcg at 03/08/16 4784   Current Outpatient Prescriptions  Medication Sig Dispense Refill  . acetaminophen (TYLENOL) 325 MG tablet Take 650 mg by mouth every 6 (six) hours as needed for moderate pain.     Marland Kitchen albuterol (PROVENTIL HFA;VENTOLIN HFA) 108 (90 Base) MCG/ACT inhaler Inhale 2 puffs into the lungs every 4 (four) hours as needed for wheezing or shortness of breath.     Marland Kitchen atorvastatin (LIPITOR) 80 MG tablet Take 80 mg by mouth at bedtime.    . benztropine (COGENTIN) 1 MG tablet Take 1 mg by mouth 2 (two) times daily.    . cholecalciferol (VITAMIN D) 1000 UNITS tablet Take 1,000 Units by mouth daily.    Marland Kitchen escitalopram (LEXAPRO) 10 MG tablet Take 10 mg by mouth daily.    . fluPHENAZine (PROLIXIN) 5 MG/ML solution Take 5 mg by mouth daily at 12 noon. At noon.    . gabapentin (NEURONTIN) 300 MG capsule Take 300 mg by mouth 3 (three) times daily.    Marland Kitchen glipiZIDE (GLUCOTROL) 10 MG tablet Take 10 mg by mouth daily before breakfast.    . HYDROcodone-acetaminophen (NORCO/VICODIN) 5-325 MG tablet Take 1 tablet by mouth every 4 (four) hours as needed. (Patient taking differently: Take 1 tablet by mouth every 4 (four) hours as needed (pain.). ) 6 tablet 0  . levothyroxine (SYNTHROID, LEVOTHROID) 25 MCG tablet Take 25 mcg by mouth daily before breakfast.    . Melatonin 1 MG TABS Take 1 tablet by mouth at bedtime.    . metFORMIN (GLUCOPHAGE) 1000 MG tablet Take 1,000 mg by mouth 2 (two) times daily with a meal.     . mirtazapine (REMERON) 30 MG tablet Take 30 mg by mouth at bedtime.    . naproxen (NAPROSYN) 500 MG tablet Take 1 tablet (500 mg total) by mouth 2 (two) times daily. 30 tablet 0  . ondansetron (ZOFRAN) 4 MG tablet Take 4 mg by mouth every 6 (six) hours as needed for nausea or vomiting.    . pantoprazole (PROTONIX) 40 MG tablet Take 40 mg by mouth 2 (two) times daily.    Marland Kitchen perphenazine (TRILAFON) 8 MG tablet Take 8 mg by mouth 2 (two) times daily.    Marland Kitchen tiotropium (SPIRIVA) 18 MCG inhalation capsule Place 18 mcg into inhaler and inhale daily.    Marland Kitchen lidocaine (LIDODERM) 5 % Place 1 patch onto the skin daily. Remove & Discard patch within 12 hours or as directed by MD (Patient not taking: Reported on  01/04/2016) 30 patch 0  . oxyCODONE-acetaminophen (PERCOCET/ROXICET) 5-325 MG tablet Take 1 tablet by mouth every 6 (six) hours as needed for severe pain. (Patient not taking: Reported on 03/07/2016) 6 tablet 0    Musculoskeletal: Strength & Muscle Tone: within normal limits Gait & Station: normal Patient leans: N/A  Psychiatric Specialty Exam: Review of Systems  Constitutional: Negative.   HENT: Negative.   Eyes: Negative.   Respiratory: Negative.   Cardiovascular: Negative.   Gastrointestinal: Negative.   Genitourinary: Negative.   Musculoskeletal: Negative.   Skin: Negative.   Neurological: Negative.  Endo/Heme/Allergies: Negative.     Blood pressure 136/78, pulse 70, temperature 98 F (36.7 C), temperature source Oral, resp. rate 18, SpO2 96 %.There is no weight on file to calculate BMI.  General Appearance: Casual  Eye Contact::  Good  Speech:  Clear and Coherent and Normal Rate  Volume:  Normal  Mood:  Euthymic  Affect:  Congruent  Thought Process:  Coherent, Goal Directed and Intact  Orientation:  Full (Time, Place, and Person)  Thought Content:  WDL  Suicidal Thoughts:  No  Homicidal Thoughts:  No  Memory:  Immediate;   Good Recent;   Good Remote;   Good  Judgement:  Fair  Insight:  Fair  Psychomotor Activity:  Normal  Concentration:  Good  Recall:  NA  Fund of Knowledge:Fair  Language: Good  Akathisia:  NA  Handed:  Right  AIMS (if indicated):     Assets:  Desire for Improvement  ADL's:  Intact  Cognition: WNL  Sleep:      Disposition:  Discharge home follow up with your Psychiatrist  Delfin Gant, NP    PMHNP-BC 03/08/2016 11:43 AM Patient seen face-to-face for psychiatric evaluation, chart reviewed and case discussed with the physician extender and developed treatment plan. Reviewed the information documented and agree with the treatment plan. Corena Pilgrim, MD

## 2016-04-20 ENCOUNTER — Emergency Department (HOSPITAL_COMMUNITY): Payer: Medicaid Other

## 2016-04-20 ENCOUNTER — Encounter (HOSPITAL_COMMUNITY): Payer: Self-pay

## 2016-04-20 ENCOUNTER — Emergency Department (HOSPITAL_COMMUNITY)
Admission: EM | Admit: 2016-04-20 | Discharge: 2016-04-20 | Disposition: A | Discharge status: Home / Self Care | Payer: Medicaid Other | Attending: Emergency Medicine | Admitting: Emergency Medicine

## 2016-04-20 DIAGNOSIS — M25562 Pain in left knee: Secondary | ICD-10-CM | POA: Diagnosis present

## 2016-04-20 DIAGNOSIS — Z791 Long term (current) use of non-steroidal anti-inflammatories (NSAID): Secondary | ICD-10-CM | POA: Insufficient documentation

## 2016-04-20 DIAGNOSIS — F17200 Nicotine dependence, unspecified, uncomplicated: Secondary | ICD-10-CM | POA: Insufficient documentation

## 2016-04-20 DIAGNOSIS — E119 Type 2 diabetes mellitus without complications: Secondary | ICD-10-CM | POA: Insufficient documentation

## 2016-04-20 DIAGNOSIS — R52 Pain, unspecified: Secondary | ICD-10-CM

## 2016-04-20 DIAGNOSIS — Z7984 Long term (current) use of oral hypoglycemic drugs: Secondary | ICD-10-CM | POA: Diagnosis not present

## 2016-04-20 DIAGNOSIS — F419 Anxiety disorder, unspecified: Secondary | ICD-10-CM | POA: Diagnosis not present

## 2016-04-20 DIAGNOSIS — M1712 Unilateral primary osteoarthritis, left knee: Secondary | ICD-10-CM | POA: Diagnosis not present

## 2016-04-20 DIAGNOSIS — Z79899 Other long term (current) drug therapy: Secondary | ICD-10-CM | POA: Insufficient documentation

## 2016-04-20 MED ORDER — NAPROXEN 500 MG PO TABS: 500.0000 mg | ORAL_TABLET | Freq: Two times a day (BID) | ORAL | Status: DC

## 2016-04-20 MED ORDER — NAPROXEN 250 MG PO TABS
500.0000 mg | ORAL_TABLET | Freq: Once | ORAL | Status: AC
  Administered 2016-04-20: 500 mg via ORAL
  Filled 2016-04-20: qty 2

## 2016-04-20 MED ORDER — NAPROXEN 375 MG PO TABS: 375.0000 mg | ORAL_TABLET | Freq: Two times a day (BID) | ORAL | Status: DC

## 2016-04-20 NOTE — ED Notes (Signed)
Pt. Arrived via EMS with lt. Knee pain, denies any present injury. She has a hx of osteoarthritis and pre existing injury.   Lt. Knee is swollen. No deformity.

## 2016-04-20 NOTE — Discharge Instructions (Signed)
Please read and follow all provided instructions.  Your diagnoses today include:  1. Left knee pain   2. Pain   3. Primary osteoarthritis of left knee     Tests performed today include:  Vital signs. See below for your results today.   Medications prescribed:   None  Home care instructions:  Follow any educational materials contained in this packet.  Follow-up instructions: Please follow-up with Orthopedics for further evaluation of symptoms and treatment   Return instructions:   Please return to the Emergency Department if you do not get better, if you get worse, or new symptoms OR  - Fever (temperature greater than 101.93F)  - Bleeding that does not stop with holding pressure to the area    -Severe pain (please note that you may be more sore the day after your accident)  - Chest Pain  - Difficulty breathing  - Severe nausea or vomiting  - Inability to tolerate food and liquids  - Passing out  - Skin becoming red around your wounds  - Change in mental status (confusion or lethargy)  - New numbness or weakness     Please return if you have any other emergent concerns.  Additional Information:  Your vital signs today were: BP 147/82 mmHg   Pulse 104   Temp(Src) 98.2 F (36.8 C) (Oral)   Resp 18   SpO2 100% If your blood pressure (BP) was elevated above 135/85 this visit, please have this repeated by your doctor within one month. ---------------

## 2016-04-20 NOTE — ED Notes (Signed)
Pt ambulatory and independent at discharge.  

## 2016-04-20 NOTE — ED Provider Notes (Signed)
CSN: [id number]     Arrival date & time 04/20/16  1830 History  By signing my name below, I, Linus Galas, attest that this documentation has been prepared under the direction and in the presence of Audry Pili, PA-C. Electronically Signed: Linus Galas, ED Scribe. 04/20/2016. 7:38 PM.   Chief Complaint  Patient presents with  . Knee Pain   The history is provided by the patient. No language interpreter was used.   HPI Comments: Carrie Carlson is a 45 y.o. female brought by EMS to the Emergency Department with a PMHx of Osteoarthritis complaining of left knee pain that began today. No fall. Pt rates her pain a 9/10 in severity. Pt states she has had pain in that knee before but not this severe. She states her pain is worse with movement and weight bearing. Pt has taken Tylenol 2 hours ago with mild relief. Pt denies fevers or any other symptoms at this time.   Past Medical History  Diagnosis Date  . Osteoarthritis   . Back pain   . Anxiety   . Diabetes mellitus without complication (HCC)    History reviewed. No pertinent past surgical history. No family history on file. Social History  Substance Use Topics  . Smoking status: Current Every Day Smoker  . Smokeless tobacco: None  . Alcohol Use: No   OB History    No data available     Review of Systems A complete 10 system review of systems was obtained and all systems are negative except as noted in the HPI and PMH.   Allergies  Review of patient's allergies indicates no known allergies.  Home Medications   Prior to Admission medications   Medication Sig Start Date End Date Taking? Authorizing Provider  acetaminophen (TYLENOL) 325 MG tablet Take 650 mg by mouth every 6 (six) hours as needed for moderate pain.    Historical Provider, MD  albuterol (PROVENTIL HFA;VENTOLIN HFA) 108 (90 Base) MCG/ACT inhaler Inhale 2 puffs into the lungs every 4 (four) hours as needed for wheezing or shortness of breath.     Historical  Provider, MD  atorvastatin (LIPITOR) 80 MG tablet Take 80 mg by mouth at bedtime.    Historical Provider, MD  benztropine (COGENTIN) 1 MG tablet Take 1 mg by mouth 2 (two) times daily.    Historical Provider, MD  cholecalciferol (VITAMIN D) 1000 UNITS tablet Take 1,000 Units by mouth daily.    Historical Provider, MD  escitalopram (LEXAPRO) 10 MG tablet Take 10 mg by mouth daily.    Historical Provider, MD  fluPHENAZine (PROLIXIN) 5 MG/ML solution Take 5 mg by mouth daily at 12 noon. At noon.    Historical Provider, MD  gabapentin (NEURONTIN) 300 MG capsule Take 300 mg by mouth 3 (three) times daily.    Historical Provider, MD  glipiZIDE (GLUCOTROL) 10 MG tablet Take 10 mg by mouth daily before breakfast.    Historical Provider, MD  levothyroxine (SYNTHROID, LEVOTHROID) 25 MCG tablet Take 25 mcg by mouth daily before breakfast.    Historical Provider, MD  Melatonin 1 MG TABS Take 1 tablet by mouth at bedtime.    Historical Provider, MD  metFORMIN (GLUCOPHAGE) 1000 MG tablet Take 1,000 mg by mouth 2 (two) times daily with a meal.     Historical Provider, MD  mirtazapine (REMERON) 30 MG tablet Take 30 mg by mouth at bedtime.    Historical Provider, MD  naproxen (NAPROSYN) 500 MG tablet Take 1 tablet (500 mg total) by  mouth 2 (two) times daily. 03/05/16   Ace Gins Sam, PA-C  ondansetron (ZOFRAN) 4 MG tablet Take 4 mg by mouth every 6 (six) hours as needed for nausea or vomiting.    Historical Provider, MD  pantoprazole (PROTONIX) 40 MG tablet Take 40 mg by mouth 2 (two) times daily.    Historical Provider, MD  perphenazine (TRILAFON) 8 MG tablet Take 8 mg by mouth 2 (two) times daily.    Historical Provider, MD  tiotropium (SPIRIVA) 18 MCG inhalation capsule Place 18 mcg into inhaler and inhale daily.    Historical Provider, MD   BP 147/82 mmHg  Pulse 104  Temp(Src) 98.2 F (36.8 C) (Oral)  Resp 18  SpO2 100%   Physical Exam  Constitutional: She is oriented to person, place, and time. She  appears well-developed and well-nourished.  HENT:  Head: Normocephalic and atraumatic.  Eyes: EOM are normal.  Neck: Normal range of motion.  Cardiovascular: Normal rate and regular rhythm.   Pulmonary/Chest: Effort normal.  Abdominal: Soft. She exhibits no distension.  Musculoskeletal:       Left knee: She exhibits decreased range of motion. She exhibits no swelling, no effusion, no ecchymosis, no deformity, no laceration, no erythema, normal alignment, no LCL laxity, no bony tenderness, normal meniscus and no MCL laxity. No tenderness found.  Negative anterior/poster drawer bilaterally. Negative ballottement test. No varus or valgus laxity. No crepitus. Pain with flexion or extension. Non TTP  Neurological: She is alert and oriented to person, place, and time.  Skin: Skin is warm and dry.  Psychiatric: She has a normal mood and affect. Her behavior is normal. Thought content normal.  Nursing note and vitals reviewed.  ED Course  Procedures  DIAGNOSTIC STUDIES: Oxygen Saturation is 100% on room air, normal by my interpretation.    COORDINATION OF CARE: 7:33 PM Will order complete right knee x-ray. Discussed treatment plan with pt at bedside and pt agreed to plan.  Labs Review Labs Reviewed - No data to display  Imaging Review Dg Knee Complete 4 Views Left  04/20/2016  CLINICAL DATA:  Left knee pain for 1 year. EXAM: LEFT KNEE - COMPLETE 4+ VIEW COMPARISON:  01/04/2016 FINDINGS: There is no evidence of fracture, dislocation, or joint effusion. 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 Heterotopic soft tissue ossification is again seen along the lateral aspect of the proximal tibia. A possible ossified joint body is again seen projecting within intracondylar notch. IMPRESSION: No acute findings. Stable tricompartmental osteoarthritis, heterotopic soft tissue ossification, and possible small ossified joint body. Electronically Signed   By: Myles Rosenthal M.D.   On: 04/20/2016 20:12   I have  personally reviewed and evaluated these images and lab results as part of my medical decision-making.   EKG Interpretation None      MDM   I have reviewed and evaluated the relevant imaging studies. I have reviewed the relevant previous healthcare records. I obtained HPI from historian.  ED Course:  Assessment: Pt is a 44yF with hx osteoarthritis who presents with left knee pain since today. On exam, pt in NAD. Nontoxic/nonseptic appearing. VSS. Afebrile. Positive McMurray on right knee exam. Ligaments appear intact. XR right knee showed stable osteoarthritis. Plan is to DC Home with follow up to Orthopedics. At time of discharge, Patient is in no acute distress. Vital Signs are stable. Patient is able to ambulate. Patient able to tolerate PO.    Disposition/Plan:  DC Home Additional Verbal discharge instructions given and discussed with patient.  Pt  Instructed to f/u with Orthopedics for evaluation and treatment of symptoms. Return precautions given Pt acknowledges and agrees with plan  Supervising Physician Loren Raceravid Yelverton, MD   Final diagnoses:  Left knee pain  Primary osteoarthritis of left knee    I personally performed the services described in this documentation, which was scribed in my presence. The recorded information has been reviewed and is accurate.   Audry Piliyler Clarene Curran, PA-C 04/20/16 2025  Loren Raceravid Yelverton, MD 04/21/16 [phone]

## 2016-04-20 NOTE — ED Notes (Signed)
Pt states she thinks she over extended her knee while moving today.  Pt wishes to remain in wheel chair.

## 2016-04-22 ENCOUNTER — Emergency Department (HOSPITAL_COMMUNITY): Payer: Medicaid Other

## 2016-04-22 ENCOUNTER — Emergency Department (HOSPITAL_COMMUNITY)
Admission: EM | Admit: 2016-04-22 | Discharge: 2016-04-22 | Disposition: A | Discharge status: Home / Self Care | Payer: Medicaid Other | Attending: Emergency Medicine | Admitting: Emergency Medicine

## 2016-04-22 ENCOUNTER — Encounter (HOSPITAL_COMMUNITY): Payer: Self-pay | Admitting: Emergency Medicine

## 2016-04-22 DIAGNOSIS — Z79899 Other long term (current) drug therapy: Secondary | ICD-10-CM | POA: Diagnosis not present

## 2016-04-22 DIAGNOSIS — E119 Type 2 diabetes mellitus without complications: Secondary | ICD-10-CM | POA: Insufficient documentation

## 2016-04-22 DIAGNOSIS — M25562 Pain in left knee: Secondary | ICD-10-CM

## 2016-04-22 DIAGNOSIS — Y9389 Activity, other specified: Secondary | ICD-10-CM | POA: Diagnosis not present

## 2016-04-22 DIAGNOSIS — Y998 Other external cause status: Secondary | ICD-10-CM | POA: Diagnosis not present

## 2016-04-22 DIAGNOSIS — S8992XA Unspecified injury of left lower leg, initial encounter: Secondary | ICD-10-CM | POA: Diagnosis present

## 2016-04-22 DIAGNOSIS — S59902A Unspecified injury of left elbow, initial encounter: Secondary | ICD-10-CM | POA: Diagnosis not present

## 2016-04-22 DIAGNOSIS — M1712 Unilateral primary osteoarthritis, left knee: Secondary | ICD-10-CM

## 2016-04-22 DIAGNOSIS — Y9289 Other specified places as the place of occurrence of the external cause: Secondary | ICD-10-CM | POA: Insufficient documentation

## 2016-04-22 DIAGNOSIS — F419 Anxiety disorder, unspecified: Secondary | ICD-10-CM | POA: Insufficient documentation

## 2016-04-22 DIAGNOSIS — F17200 Nicotine dependence, unspecified, uncomplicated: Secondary | ICD-10-CM | POA: Diagnosis not present

## 2016-04-22 DIAGNOSIS — Z7984 Long term (current) use of oral hypoglycemic drugs: Secondary | ICD-10-CM | POA: Diagnosis not present

## 2016-04-22 DIAGNOSIS — W010XXA Fall on same level from slipping, tripping and stumbling without subsequent striking against object, initial encounter: Secondary | ICD-10-CM | POA: Insufficient documentation

## 2016-04-22 DIAGNOSIS — Z791 Long term (current) use of non-steroidal anti-inflammatories (NSAID): Secondary | ICD-10-CM | POA: Diagnosis not present

## 2016-04-22 MED ORDER — OXYCODONE-ACETAMINOPHEN 5-325 MG PO TABS
1.0000 | ORAL_TABLET | Freq: Once | ORAL | Status: AC
  Administered 2016-04-22: 1 via ORAL
  Filled 2016-04-22: qty 1

## 2016-04-22 NOTE — Discharge Instructions (Signed)
*  PRICE in the first 24-48 hours after injury: °Protect (with brace, splint, sling), if given by your provider °Rest °Ice- Do not apply ice pack directly to your skin, place towel or similar between your skin and ice/ice pack. Apply ice for 20 min, then remove for 40 min while awake °Compression- Wear brace, elastic bandage, splint as directed by your provider °Elevate affected extremity above the level of your heart when not walking around for the first 24-48 hours  ° °Use Ibuprofen (Motrin/Advil) 600mg every 6 hours as needed for pain  ° ° °

## 2016-04-22 NOTE — ED Notes (Signed)
Received pt via EMS with c/o fall today causing injury to left knee. Pt seen here recently for left knee pain and was given NSAIDS for pain, pt reports pain medication not effective. Pt also told EMS that when she was seen 2 days ago she was told she fractured her knee.

## 2016-04-22 NOTE — ED Provider Notes (Signed)
CSN: [id number]     Arrival date & time 04/22/16  1820 History  By signing my name below, I, Doreatha Martin, attest that this documentation has been prepared under the direction and in the presence of Audry Pili, PA-C. Electronically Signed: Doreatha Martin, ED Scribe. 04/22/2016. 6:41 PM.    Chief Complaint  Patient presents with  . Fall  . Knee Pain   The history is provided by the patient. No language interpreter was used.   HPI Comments: Carrie Carlson is a 45 y.o. female with h/o osteoarthritis who presents to the Emergency Department complaining of moderate 8/10 left knee pain s/p mechanical fall that occurred today. Pt states that she slipped on the wet floor, fell forward and landed on her left knee and left elbow. Denies LOC or head injury. Pt was seen in the ED 2 days ago for similar knee pain without fall; XR showed stable osteoarthritis. Pt states she has been taking tylenol, naproxen and tylenol with no relief of pain. She reports her current pain is similar, but more severe after falling. Pt also complains of mild left elbow pain since falling, which is improving. She is able to move the arm without difficulty. She is ambulatory, but with mild difficulty secondary to pain. Pt is able to flex and extend the knee with minimal difficulty. Denies numbness, weakness, paresthesia, additional injuries.   Past Medical History  Diagnosis Date  . Osteoarthritis   . Back pain   . Anxiety   . Diabetes mellitus without complication (HCC)    History reviewed. No pertinent past surgical history. No family history on file. Social History  Substance Use Topics  . Smoking status: Current Every Day Smoker  . Smokeless tobacco: None  . Alcohol Use: No   OB History    No data available     Review of Systems A complete 10 system review of systems was obtained and all systems are negative except as noted in the HPI and PMH.    Allergies  Review of patient's allergies indicates no known  allergies.  Home Medications   Prior to Admission medications   Medication Sig Start Date End Date Taking? Authorizing Provider  acetaminophen (TYLENOL) 325 MG tablet Take 650 mg by mouth every 6 (six) hours as needed for moderate pain.    Historical Provider, MD  albuterol (PROVENTIL HFA;VENTOLIN HFA) 108 (90 Base) MCG/ACT inhaler Inhale 2 puffs into the lungs every 4 (four) hours as needed for wheezing or shortness of breath.     Historical Provider, MD  atorvastatin (LIPITOR) 80 MG tablet Take 80 mg by mouth at bedtime.    Historical Provider, MD  benztropine (COGENTIN) 1 MG tablet Take 1 mg by mouth 2 (two) times daily.    Historical Provider, MD  cholecalciferol (VITAMIN D) 1000 UNITS tablet Take 1,000 Units by mouth daily.    Historical Provider, MD  escitalopram (LEXAPRO) 10 MG tablet Take 10 mg by mouth daily.    Historical Provider, MD  fluPHENAZine (PROLIXIN) 5 MG/ML solution Take 5 mg by mouth daily at 12 noon. At noon.    Historical Provider, MD  gabapentin (NEURONTIN) 300 MG capsule Take 300 mg by mouth 3 (three) times daily.    Historical Provider, MD  glipiZIDE (GLUCOTROL) 10 MG tablet Take 10 mg by mouth daily before breakfast.    Historical Provider, MD  levothyroxine (SYNTHROID, LEVOTHROID) 25 MCG tablet Take 25 mcg by mouth daily before breakfast.    Historical Provider, MD  Melatonin  1 MG TABS Take 1 tablet by mouth at bedtime.    Historical Provider, MD  metFORMIN (GLUCOPHAGE) 1000 MG tablet Take 1,000 mg by mouth 2 (two) times daily with a meal.     Historical Provider, MD  mirtazapine (REMERON) 30 MG tablet Take 30 mg by mouth at bedtime.    Historical Provider, MD  naproxen (NAPROSYN) 375 MG tablet Take 1 tablet (375 mg total) by mouth 2 (two) times daily with a meal. 04/20/16   Audry Pili, PA-C  ondansetron (ZOFRAN) 4 MG tablet Take 4 mg by mouth every 6 (six) hours as needed for nausea or vomiting.    Historical Provider, MD  pantoprazole (PROTONIX) 40 MG tablet Take 40  mg by mouth 2 (two) times daily.    Historical Provider, MD  perphenazine (TRILAFON) 8 MG tablet Take 8 mg by mouth 2 (two) times daily.    Historical Provider, MD  tiotropium (SPIRIVA) 18 MCG inhalation capsule Place 18 mcg into inhaler and inhale daily.    Historical Provider, MD   BP 152/105 mmHg  Pulse 121  Temp(Src) 98.2 F (36.8 C) (Oral)  Resp 18  Ht  (1.702 m)  SpO2 96%   Physical Exam  Constitutional: She is oriented to person, place, and time. She appears well-developed and well-nourished.  HENT:  Head: Normocephalic.  Eyes: Conjunctivae are normal.  Cardiovascular: Normal rate.   Pulmonary/Chest: Effort normal. No respiratory distress.  Abdominal: She exhibits no distension.  Musculoskeletal: Normal range of motion. She exhibits tenderness.  No MCL or LCL laxity. No pain with varus or valgus strain. Patella stable. Tenderness along the left medial patellar joint line. FROM. No joint effusion, no obvious deformity.   Neurological: She is alert and oriented to person, place, and time. Coordination normal.  Strength and sensation equal and intact bilaterally throughout the upper and lower extremities.Normal gait. Coordination intact.    Skin: Skin is warm and dry.  Psychiatric: She has a normal mood and affect. Her behavior is normal.  Nursing note and vitals reviewed.   ED Course  Procedures (including critical care time) DIAGNOSTIC STUDIES: Oxygen Saturation is 96% on RA, adequate by my interpretation.    COORDINATION OF CARE: 6:37 PM Discussed treatment plan with pt at bedside which includes XR and pt agreed to plan.   Imaging Review Dg Knee Complete 4 Views Left  04/20/2016  CLINICAL DATA:  Left knee pain for 1 year. EXAM: LEFT KNEE - COMPLETE 4+ VIEW COMPARISON:  01/04/2016 FINDINGS: There is no evidence of fracture, dislocation, or joint effusion. 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 Heterotopic soft tissue ossification is again seen along the lateral  aspect of the proximal tibia. A possible ossified joint body is again seen projecting within intracondylar notch. IMPRESSION: No acute findings. Stable tricompartmental osteoarthritis, heterotopic soft tissue ossification, and possible small ossified joint body. Electronically Signed   By: Myles Rosenthal M.D.   On: 04/20/2016 20:12   I have personally reviewed and evaluated these images as part of my medical decision-making.   EKG Interpretation None      MDM  I have reviewed and evaluated the relevant imaging studies. I interpreted the relevant EKG. I have reviewed the relevant previous healthcare records. I obtained HPI from historian.  ED Course:  Assessment: Pt presents with new knee pain from previous visit due to fall. Patient X-Ray negative for obvious fracture or dislocation. Did show stable moderate tricompartmental osteoarthritis from previous imaging. Counseled patient to see Orthopedics that was offered from  previous visit over the weekend. Pt advised to follow up with orthopedics. Patient given knee immobilizer while in ED for comfort and at patient's request, conservative therapy recommended and discussed. Patient will be discharged home & is agreeable with above plan. Returns precautions discussed. Pt appears safe for discharge.   Disposition/Plan:  DC home Additional Verbal discharge instructions given and discussed with patient.  Pt Instructed to f/u with orthopedics in the next week for evaluation and treatment of symptoms. Return precautions given Pt acknowledges and agrees with plan  Supervising Physician Rolland PorterMark James, MD   Final diagnoses:  Left knee pain  Primary osteoarthritis of left knee   I personally performed the services described in this documentation, which was scribed in my presence. The recorded information has been reviewed and is accurate.   Audry Piliyler Haile Toppins, PA-C 04/22/16 1927  Rolland PorterMark James, MD 05/04/16 [phone]

## 2016-04-28 ENCOUNTER — Encounter (HOSPITAL_COMMUNITY): Payer: Self-pay

## 2016-04-28 ENCOUNTER — Emergency Department (HOSPITAL_COMMUNITY): Payer: Medicaid Other

## 2016-04-28 ENCOUNTER — Emergency Department (HOSPITAL_COMMUNITY)
Admission: EM | Admit: 2016-04-28 | Discharge: 2016-04-28 | Disposition: A | Discharge status: Home / Self Care | Payer: Medicaid Other | Attending: Emergency Medicine | Admitting: Emergency Medicine

## 2016-04-28 DIAGNOSIS — E669 Obesity, unspecified: Secondary | ICD-10-CM | POA: Insufficient documentation

## 2016-04-28 DIAGNOSIS — R1032 Left lower quadrant pain: Secondary | ICD-10-CM | POA: Insufficient documentation

## 2016-04-28 DIAGNOSIS — Z79899 Other long term (current) drug therapy: Secondary | ICD-10-CM | POA: Diagnosis not present

## 2016-04-28 DIAGNOSIS — Z3202 Encounter for pregnancy test, result negative: Secondary | ICD-10-CM | POA: Insufficient documentation

## 2016-04-28 DIAGNOSIS — F17200 Nicotine dependence, unspecified, uncomplicated: Secondary | ICD-10-CM | POA: Diagnosis not present

## 2016-04-28 DIAGNOSIS — Z791 Long term (current) use of non-steroidal anti-inflammatories (NSAID): Secondary | ICD-10-CM | POA: Diagnosis not present

## 2016-04-28 DIAGNOSIS — E1165 Type 2 diabetes mellitus with hyperglycemia: Secondary | ICD-10-CM | POA: Diagnosis present

## 2016-04-28 DIAGNOSIS — M1990 Unspecified osteoarthritis, unspecified site: Secondary | ICD-10-CM | POA: Diagnosis not present

## 2016-04-28 DIAGNOSIS — R739 Hyperglycemia, unspecified: Secondary | ICD-10-CM

## 2016-04-28 DIAGNOSIS — F419 Anxiety disorder, unspecified: Secondary | ICD-10-CM | POA: Diagnosis not present

## 2016-04-28 DIAGNOSIS — R1030 Lower abdominal pain, unspecified: Secondary | ICD-10-CM

## 2016-04-28 DIAGNOSIS — Z7984 Long term (current) use of oral hypoglycemic drugs: Secondary | ICD-10-CM | POA: Diagnosis not present

## 2016-04-28 LAB — CBC
HEMATOCRIT: 44.7 % (ref 36.0–46.0)
HEMOGLOBIN: 14.3 g/dL (ref 12.0–15.0)
MCH: 28.7 pg (ref 26.0–34.0)
MCHC: 32 g/dL (ref 30.0–36.0)
MCV: 89.8 fL (ref 78.0–100.0)
Platelets: 327 10*3/uL (ref 150–400)
RBC: 4.98 MIL/uL (ref 3.87–5.11)
RDW: 13.9 % (ref 11.5–15.5)
WBC: 13.8 10*3/uL — AB (ref 4.0–10.5)

## 2016-04-28 LAB — BASIC METABOLIC PANEL
ANION GAP: 13 (ref 5–15)
BUN: 7 mg/dL (ref 6–20)
CO2: 25 mmol/L (ref 22–32)
Calcium: 9.4 mg/dL (ref 8.9–10.3)
Chloride: 97 mmol/L — ABNORMAL LOW (ref 101–111)
Creatinine, Ser: 0.81 mg/dL (ref 0.44–1.00)
GFR calc Af Amer: >60 mL/min (ref >60)
Glucose, Bld: 417 mg/dL — ABNORMAL HIGH (ref 65–99)
POTASSIUM: 4.6 mmol/L (ref 3.5–5.1)
SODIUM: 135 mmol/L (ref 135–145)

## 2016-04-28 LAB — URINALYSIS, ROUTINE W REFLEX MICROSCOPIC
Bilirubin Urine: NEGATIVE
Glucose, UA: >1000 mg/dL — AB
Hgb urine dipstick: NEGATIVE
KETONES UR: NEGATIVE mg/dL
LEUKOCYTES UA: NEGATIVE
NITRITE: NEGATIVE
PH: 6.5 (ref 5.0–8.0)
PROTEIN: NEGATIVE mg/dL
Specific Gravity, Urine: 1.011 (ref 1.005–1.030)

## 2016-04-28 LAB — WET PREP, GENITAL
Clue Cells Wet Prep HPF POC: NONE SEEN
Sperm: NONE SEEN
Trich, Wet Prep: NONE SEEN
YEAST WET PREP: NONE SEEN

## 2016-04-28 LAB — LIPASE, BLOOD: LIPASE: 34 U/L (ref 11–51)

## 2016-04-28 LAB — CBG MONITORING, ED
GLUCOSE-CAPILLARY: 437 mg/dL — AB (ref 65–99)
GLUCOSE-CAPILLARY: 186 mg/dL — AB (ref 65–99)

## 2016-04-28 LAB — URINE MICROSCOPIC-ADD ON: BACTERIA UA: NONE SEEN

## 2016-04-28 MED ORDER — MORPHINE SULFATE (PF) 4 MG/ML IV SOLN
6.0000 mg | Freq: Once | INTRAVENOUS | Status: AC
  Administered 2016-04-28: 6 mg via INTRAVENOUS
  Filled 2016-04-28: qty 2

## 2016-04-28 MED ORDER — INSULIN ASPART 100 UNIT/ML ~~LOC~~ SOLN
10.0000 [IU] | Freq: Once | SUBCUTANEOUS | Status: AC
  Administered 2016-04-28: 10 [IU] via SUBCUTANEOUS
  Filled 2016-04-28: qty 1

## 2016-04-28 MED ORDER — IOPAMIDOL (ISOVUE-300) INJECTION 61%
INTRAVENOUS | Status: AC
  Administered 2016-04-28: 100 mL
  Filled 2016-04-28: qty 100

## 2016-04-28 MED ORDER — SODIUM CHLORIDE 0.9 % IV BOLUS (SEPSIS)
1000.0000 mL | Freq: Once | INTRAVENOUS | Status: AC
  Administered 2016-04-28: 1000 mL via INTRAVENOUS

## 2016-04-28 MED ORDER — HYDROCODONE-ACETAMINOPHEN 5-325 MG PO TABS: 2.0000 | ORAL_TABLET | ORAL | Status: AC | PRN

## 2016-04-28 MED ORDER — HYDROMORPHONE HCL 1 MG/ML IJ SOLN
1.0000 mg | Freq: Once | INTRAMUSCULAR | Status: AC
  Administered 2016-04-28: 1 mg via INTRAVENOUS
  Filled 2016-04-28: qty 1

## 2016-04-28 MED ORDER — SODIUM CHLORIDE 0.9 % IV SOLN
INTRAVENOUS | Status: DC
  Filled 2016-04-28: qty 2.5

## 2016-04-28 MED ORDER — ONDANSETRON HCL 4 MG/2ML IJ SOLN
4.0000 mg | Freq: Once | INTRAMUSCULAR | Status: AC
  Administered 2016-04-28: 4 mg via INTRAVENOUS
  Filled 2016-04-28: qty 2

## 2016-04-28 MED ORDER — HYDROMORPHONE HCL 1 MG/ML IJ SOLN
1.0000 mg | Freq: Once | INTRAMUSCULAR | Status: AC
  Administered 2016-04-28: 1 mg via INTRAMUSCULAR
  Filled 2016-04-28: qty 1

## 2016-04-28 NOTE — ED Notes (Signed)
PA at bedside for ultrasound IV

## 2016-04-28 NOTE — ED Notes (Signed)
Pt called for room placement no answer

## 2016-04-28 NOTE — ED Provider Notes (Signed)
CSN: [id number]     Arrival date & time 04/28/16  1038 History   First MD Initiated Contact with Patient 04/28/16 1135     Chief Complaint  Patient presents with  . Hyperglycemia  . Abdominal Pain     (Consider location/radiation/quality/duration/timing/severity/associated sxs/prior Treatment) HPI   45 year old female with history of non-insulin-dependent diabetes, anxiety, osteoarthritis brought here via EMS from home for evaluation of abdominal pain. Patient developed acute onset of low abdominal pain today at approximately 2 hours ago. She described the pain as a throbbing sensation, nonradiating, persistent, moderate in intensity and nothing seems to make it better or worse. She also endorsed feeling dizzy for the past 2 days. Describe symptoms of room spinning sensation and lightheadedness especially with positional changes. Furthermore she also complaining of having prolonged urinary incontinence for the past several years in which she has to wear an adult diaper and usually soaked through several diapers a day. She believes her urine incontinence may have caused her to feel dizzy and lightheadedness. She endorses subjective fever. She denies having chest pain, shortness of breath, back pain, burning urination but does endorse urinary frequency. She also endorsed having diarrhea for the past several days of nonbloody non-mucousy diarrhea. No recent antibiotic use or recent travel. She does not have a PCP at this time. Patient states she is not sexually active. She is currently menopausal.  Past Medical History  Diagnosis Date  . Osteoarthritis   . Back pain   . Anxiety   . Diabetes mellitus without complication (HCC)    History reviewed. No pertinent past surgical history. No family history on file. Social History  Substance Use Topics  . Smoking status: Current Every Day Smoker  . Smokeless tobacco: None  . Alcohol Use: No   OB History    No data available     Review of  Systems  All other systems reviewed and are negative.     Allergies  Review of patient's allergies indicates no known allergies.  Home Medications   Prior to Admission medications   Medication Sig Start Date End Date Taking? Authorizing Provider  acetaminophen (TYLENOL) 325 MG tablet Take 650 mg by mouth every 6 (six) hours as needed for moderate pain.    Historical Provider, MD  albuterol (PROVENTIL HFA;VENTOLIN HFA) 108 (90 Base) MCG/ACT inhaler Inhale 2 puffs into the lungs every 4 (four) hours as needed for wheezing or shortness of breath.     Historical Provider, MD  atorvastatin (LIPITOR) 80 MG tablet Take 80 mg by mouth at bedtime.    Historical Provider, MD  benztropine (COGENTIN) 1 MG tablet Take 1 mg by mouth 2 (two) times daily.    Historical Provider, MD  cholecalciferol (VITAMIN D) 1000 UNITS tablet Take 1,000 Units by mouth daily.    Historical Provider, MD  escitalopram (LEXAPRO) 10 MG tablet Take 10 mg by mouth daily.    Historical Provider, MD  fluPHENAZine (PROLIXIN) 5 MG/ML solution Take 5 mg by mouth daily at 12 noon. At noon.    Historical Provider, MD  gabapentin (NEURONTIN) 300 MG capsule Take 300 mg by mouth 3 (three) times daily.    Historical Provider, MD  glipiZIDE (GLUCOTROL) 10 MG tablet Take 10 mg by mouth daily before breakfast.    Historical Provider, MD  levothyroxine (SYNTHROID, LEVOTHROID) 25 MCG tablet Take 25 mcg by mouth daily before breakfast.    Historical Provider, MD  Melatonin 1 MG TABS Take 1 tablet by mouth at bedtime.  Historical Provider, MD  metFORMIN (GLUCOPHAGE) 1000 MG tablet Take 1,000 mg by mouth 2 (two) times daily with a meal.     Historical Provider, MD  mirtazapine (REMERON) 30 MG tablet Take 30 mg by mouth at bedtime.    Historical Provider, MD  naproxen (NAPROSYN) 375 MG tablet Take 1 tablet (375 mg total) by mouth 2 (two) times daily with a meal. 04/20/16   Audry Pili, PA-C  ondansetron (ZOFRAN) 4 MG tablet Take 4 mg by mouth  every 6 (six) hours as needed for nausea or vomiting.    Historical Provider, MD  pantoprazole (PROTONIX) 40 MG tablet Take 40 mg by mouth 2 (two) times daily.    Historical Provider, MD  perphenazine (TRILAFON) 8 MG tablet Take 8 mg by mouth 2 (two) times daily.    Historical Provider, MD  tiotropium (SPIRIVA) 18 MCG inhalation capsule Place 18 mcg into inhaler and inhale daily.    Historical Provider, MD   BP 125/77 mmHg  Pulse 85  Temp(Src) 98 F (36.7 C) (Oral)  Resp 18  SpO2 97% Physical Exam  Constitutional: She is oriented to person, place, and time. She appears well-developed and well-nourished. No distress.  Obese Caucasian female nontoxic in appearance  HENT:  Head: Atraumatic.  Eyes: Conjunctivae are normal.  Neck: Neck supple.  Cardiovascular: Normal rate and regular rhythm.   Pulmonary/Chest: Effort normal and breath sounds normal.  Abdominal:  Protuberant abdomen with tenderness to suprapubic region and left lower quadrant on palpation but no guarding or rebound tenderness. Bowel sounds present. Negative Murphy sign and no pain at McBurney's point  Genitourinary:  Chaperone present during exam.  Neurological: She is alert and oriented to person, place, and time.  Skin: No rash noted.  Psychiatric: She has a normal mood and affect.  Nursing note and vitals reviewed.   ED Course  Procedures (including critical care time) Labs Review Labs Reviewed  WET PREP, GENITAL - Abnormal; Notable for the following:    WBC, Wet Prep HPF POC MANY (*)    All other components within normal limits  BASIC METABOLIC PANEL - Abnormal; Notable for the following:    Chloride 97 (*)    Glucose, Bld 417 (*)    All other components within normal limits  CBC - Abnormal; Notable for the following:    WBC 13.8 (*)    All other components within normal limits  URINALYSIS, ROUTINE W REFLEX MICROSCOPIC (NOT AT [facility]) - Abnormal; Notable for the following:    Glucose, UA >1000 (*)    All  other components within normal limits  URINE MICROSCOPIC-ADD ON - Abnormal; Notable for the following:    Squamous Epithelial / LPF 0-5 (*)    All other components within normal limits  CBG MONITORING, ED - Abnormal; Notable for the following:    Glucose-Capillary 437 (*)    All other components within normal limits  LIPASE, BLOOD  CBG MONITORING, ED  POC URINE PREG, ED  CBG MONITORING, ED  GC/CHLAMYDIA PROBE AMP ([place]) NOT AT [facility]    Imaging Review No results found. I have personally reviewed and evaluated these images and lab results as part of my medical decision-making.   EKG Interpretation None      MDM   Final diagnoses:  Lower abdominal pain  Hyperglycemia    BP 96/70 mmHg  Pulse 72  Temp(Src) 97.8 F (36.6 C) (Oral)  Resp 18  SpO2 96%   12:47 PM Patient with history of non-insulin-dependent  diabetes here with acute onset of low abdominal pain along with dysuria. History of urinary incontinence. On exam she does have tenderness (suprapubic and left lower quadrant. Negative Murphy sign, no pain at McBurney's point.  3:39 PM Elevated WBC of 13.8. Elevated glucose of 417 with normal anion gap. Urine shows no signs of urinary tract infection wet prep shows many WBC likely a contaminant. Patient states she is not sexually active. She is currently awaiting an abdominal and pelvis CT scan for further evaluation of abdominal pain. IV fluid, and insulin given to help treat her hyperglycemia.  Care discussed with on coming provider who will follow-up on the CT scan and reassessed patient. Patient currently stable.    Fayrene Helper, PA-C 04/29/16 1610  Lorre Nick, MD 05/04/16 [phone]

## 2016-04-28 NOTE — ED Notes (Signed)
Minimal pain  Liter nss infusing at specified rate

## 2016-04-28 NOTE — ED Notes (Signed)
CBG 186 

## 2016-04-28 NOTE — ED Notes (Signed)
Unable to gain IV access, PA to complete pelvic, IM pain medication has helped

## 2016-04-28 NOTE — ED Notes (Signed)
Unable to gain IV access, will have second RN attempt. PA made aware.

## 2016-04-28 NOTE — ED Notes (Signed)
IV team at bedside 

## 2016-04-28 NOTE — ED Notes (Signed)
Pt states she needs more pain medicine, stated that pain is at a 6 or 7 on the 0-10 pain scale. RN notified.

## 2016-04-28 NOTE — ED Notes (Signed)
The pt rfetgurned from c-t  C/o pain still  2nd liter of nss just started

## 2016-04-28 NOTE — ED Notes (Signed)
Pt pulled her own iv out left lying in the floor

## 2016-04-28 NOTE — ED Notes (Signed)
Plan of care discussed with PA will give 1 liter of fluid and 10 units of insulin then recheck sugar before insulin drip is started

## 2016-04-28 NOTE — Discharge Instructions (Signed)
Take your medications as prescribed as prescribed for pain relief. I recommend continuing to drink fluids at home to remain hydrated at home. Please follow up with a primary care provider from the Resource Guide provided below in 2-3 days in order to establish care and received outpatient management and treatment of your diabetes. I recommend following up with the OB/GYN clinic listed above to schedule an appointment for further management and evaluation of your chronic urinary incontinence. Please return to the Emergency Department if symptoms worsen or new onset of fever, worsening abdominal pain, vomiting, blood in vomit or stool, vaginal bleeding, unable to keep fluids down, lightheadedness, dizziness, chest pain, difficulty breathing.

## 2016-04-28 NOTE — ED Notes (Signed)
[facility]. Patient concerned that she having increased urination that is different from just her high blood sugar. Patient also complains of left knee pain from arthritis.

## 2016-04-28 NOTE — ED Notes (Signed)
Patient with right lower abdominal pain, chronic in nature, more severe last 2 days associated with urinary incontinence. Patient with hx of incontinence, wears depends daily. Patient states symptoms today are not new.

## 2016-04-28 NOTE — ED Notes (Signed)
Pt to c-t  approx 350 ml nss still to infuse on the first liter

## 2016-04-28 NOTE — ED Notes (Signed)
Pt requesting analgesic. States that pain is at a 7 on a scale of 0-10. RN notified.

## 2016-04-28 NOTE — ED Provider Notes (Signed)
Hand-off from Fayrene HelperBowie Tran, PA-C. CT Abdomen pending.   Briefly patient is a 45 year old female with past medical history of diabetes who presents to the ED with lower abdominal pain, onset today. Patient reports having acute onset of throbbing constant pain to her lower abdomen, denies any aggravating or alleviating factors. Endorses associated dysuria and nonbloody diarrhea. Denies being sexually active. No PCP. Endorses chronic urinary incontinence which she has had over the past year but denies following up with anyone for further management. Exam performed by initial provider revealed protuberant abdomen with tenderness to suprapubic region and left lower quadrant, no peritoneal signs. Pelvic exam unremarkable. Patient given Zofran, pain meds and IV fluids. WBC 13.8. Glu 437, no anion gap. Negative pregnancy. UA positive for glucose, no evidence of UTI. Plan to order CT abdomen for further evaluation of abdominal pain. Pt was given IVF and 10 units of insulin.   CT abdomen negative. On my initial evaluation, patient reports she is still having lower abdominal pain. Exam revealed mild diffuse lower abdominal tenderness, no peritoneal signs. Patient given second dose of IV Dilaudid. Pt given 2L IVF in the ED. On reevaluation patient reports she is feeling significantly better, patient denies any pain or complaints at this time. Patient able tolerate by mouth in the ED. Repeat CBG 186. BP 96/70, remaining vitals stable. Pt's BP has remained stable at systolic 99-95 and 66-70 diastolic. I suspect mild decrease in pt's BP due to pain meds given in the ED. Due to pt's BP remaining stable s/p 2L IVF and pt able to stand and ambulate without any assistance, I feel she is hemodynamically stable at appropriate for d/c. Discussed CT results with patient. Plan to d/c pt home with pain meds and outpatient follow up. Advised patient to follow up with a PCP regarding outpatient management of her diabetes and abdominal  pain. Pt also advised to follow up with OGYN regarding chronic urinary incontinence. Pt given resources for out patient follow up. Discussed strict return precautions.    Satira Sarkicole Elizabeth DeFuniak SpringsNadeau, [place] JerseyPA-C 04/28/16 2122  Pricilla LovelessScott Goldston, MD 05/03/16 2116

## 2016-04-28 NOTE — ED Notes (Signed)
IV and urine checked off on wrong patient. RN will reorder urine at this time.

## 2016-04-30 LAB — GC/CHLAMYDIA PROBE AMP (~~LOC~~) NOT AT ARMC
CHLAMYDIA, DNA PROBE: NEGATIVE
Neisseria Gonorrhea: NEGATIVE

## 2016-05-22 ENCOUNTER — Emergency Department (HOSPITAL_COMMUNITY)
Admission: EM | Admit: 2016-05-22 | Discharge: 2016-05-22 | Disposition: A | Discharge status: Home / Self Care | Payer: Medicaid Other | Attending: Emergency Medicine | Admitting: Emergency Medicine

## 2016-05-22 ENCOUNTER — Encounter (HOSPITAL_COMMUNITY): Payer: Self-pay

## 2016-05-22 DIAGNOSIS — Z791 Long term (current) use of non-steroidal anti-inflammatories (NSAID): Secondary | ICD-10-CM | POA: Diagnosis not present

## 2016-05-22 DIAGNOSIS — F17200 Nicotine dependence, unspecified, uncomplicated: Secondary | ICD-10-CM | POA: Diagnosis not present

## 2016-05-22 DIAGNOSIS — F419 Anxiety disorder, unspecified: Secondary | ICD-10-CM | POA: Diagnosis not present

## 2016-05-22 DIAGNOSIS — Z79899 Other long term (current) drug therapy: Secondary | ICD-10-CM | POA: Diagnosis not present

## 2016-05-22 DIAGNOSIS — Z7984 Long term (current) use of oral hypoglycemic drugs: Secondary | ICD-10-CM | POA: Diagnosis not present

## 2016-05-22 DIAGNOSIS — M1990 Unspecified osteoarthritis, unspecified site: Secondary | ICD-10-CM | POA: Diagnosis not present

## 2016-05-22 DIAGNOSIS — G8929 Other chronic pain: Secondary | ICD-10-CM | POA: Diagnosis not present

## 2016-05-22 DIAGNOSIS — M25562 Pain in left knee: Secondary | ICD-10-CM | POA: Diagnosis not present

## 2016-05-22 DIAGNOSIS — E119 Type 2 diabetes mellitus without complications: Secondary | ICD-10-CM | POA: Insufficient documentation

## 2016-05-22 MED ORDER — OXYCODONE-ACETAMINOPHEN 5-325 MG PO TABS
1.0000 | ORAL_TABLET | Freq: Once | ORAL | Status: AC
Start: 2016-05-22 — End: 2016-05-22
  Administered 2016-05-22: 1 via ORAL
  Filled 2016-05-22: qty 1

## 2016-05-22 MED ORDER — KETOROLAC TROMETHAMINE 60 MG/2ML IM SOLN
60.0000 mg | Freq: Once | INTRAMUSCULAR | Status: AC
  Administered 2016-05-22: 60 mg via INTRAMUSCULAR
  Filled 2016-05-22: qty 2

## 2016-05-22 MED ORDER — NAPROXEN 500 MG PO TABS: 500.0000 mg | ORAL_TABLET | Freq: Two times a day (BID) | ORAL | Status: DC

## 2016-05-22 NOTE — Progress Notes (Signed)
Orthopedic Tech Progress Note Patient Details:  Carrie Carlson 27-Nov-1971 [id number]  Ortho Devices Type of Ortho Device: Knee Sleeve Ortho Device/Splint Location: lle Ortho Device/Splint Interventions: Application   [name] 05/22/2016, 2:44 PM

## 2016-05-22 NOTE — Discharge Instructions (Signed)
You have been seen today for knee pain. It is recommended that you follow-up with orthopedics as soon as possible. Call the number provided to set up an appointment with Dr. Roda ShuttersXu. Call the [facility] to establish yourself with a PCP. Follow up with PCP as needed. Return to ED should symptoms worsen.  RESOURCE GUIDE  Chronic Pain Problems: Contact [facility]  [phone] Patients need to be referred by their primary care doctor.  Insufficient Money for Medicine: Contact United Way:  call "211" or [facility] [phone].  No Primary Care Doctor: - Call Health Connect  [phone] - can help you locate a primary care doctor that  accepts your insurance, provides certain services, etc. - Physician Referral Service- [phone]  Agencies that provide inexpensive medical care: - [facility]  [phone] - [facility]  [phone] - [facility]  [phone] - Women's Clinic  [phone] - Planned Parenthood  [phone] [facility]  [phone]  Medicaid-accepting [facility] Providers: - [facility]- [address], [address]  [phone], Mon-Fri 9am-7pm, Sat 9am-1pm - [facility]- [address], [address] [place]  [phone] - [facility]- [address], [address] [place]  [phone] [facility]- [address]  [phone] - Renaye RakersVeita Bland- [address], [address], [phone]  Only accepts [place] patients after they have their name  applied to their card  Self Pay (no insurance) in [place]: - Sickle Cell Patients: Dr Willey BladeEric Dean, [facility]  [address], [phone] - [facility]- [address]  [phone]       Redge Gainer-     [facility]- [address], [address]       -     [facility]- see information above (Speak to ViacomPam  H if you do not have insurance)       -  [facility]- [address], [phone]       -  [facility]- [address],  [phone]       -  [facility]- [address], [phone]       -  Dr Julio Sickssei-Bonsu-  [address], [address], [place], [phone]       -  [facility]- [address], [phone]       -  [facility]- [address], [phone], also [address], [phone]       -    [facility]- [address], [phone], 1st & 3rd Saturday   every month, 10am-1pm  1) Find a Doctor and Pay Out of Pocket Although you won't have to find out who is covered by your insurance plan, it is a good idea to ask around and get recommendations. You will then need to call the office and see if the doctor you have chosen will accept you as a new patient and what types of options they offer for patients who are self-pay. Some doctors offer discounts or will set up payment plans for their patients who do not have insurance, but you will need to ask so you aren't surprised when you get to your appointment.  2) Contact Your Local Health Department Not all health departments have doctors  that can see patients for sick visits, but many do, so it is worth a call to see if yours does. If you don't know where your local health department is, you can check in your phone book. The CDC also has a tool to help you locate your state's health department, and many state websites also have listings of all of their local health departments.  3) Find a Walk-in Clinic If your illness is not likely to be very severe or complicated, you may want to try a walk in clinic. These are popping up all over the country in pharmacies, drugstores, and shopping centers. They're usually staffed by nurse practitioners or physician assistants that have been trained to treat common illnesses and complaints. They're usually fairly quick and inexpensive. However, if  you have serious medical issues or chronic medical problems, these are probably not your best option  STD Testing - [facility] Department of [facility], STD Clinic, [address] [place], phone [phone] or [phone].  Monday - Friday, call for an appointment. [facility] Department of [employer], STD Clinic, [address], [place], phone [phone] or [phone].  Monday - Friday, call for an appointment.  Abuse/Neglect: [facility] Child Abuse Hotline 641-285-0678 [facility] Child Abuse Hotline 419-704-9399 (After Hours)  Emergency Shelter:  [facility] 914-601-2688  Maternity Homes: - Room at the [place] [facility] [phone] - [facility] [phone]  MRSA Hotline #:   7260558054  [facility] Resources  [facility] [place]  United [facility] Dept. [address]                 [address]         [address] [place] [address]  Blondell Reveal Phone:  [phone]                                  Phone:  [phone]                   Phone:  [phone]  [facility], [phone] - [phone]       -     [facility] in [place], [address],                                  [phone], [facility] Child Abuse Hotline (548)453-7845 or 636-347-9368 (After Hours)   Behavioral Health Services  Substance Abuse Resources: - Alcohol and Drug Services  [phone] - [facility] [phone] - [facility] [phone] Floydene Flock [phone] - Residential & Outpatient Substance Abuse Program  [phone]  Psychological Services: [facility]  [phone] Services  [phone] - [facility], [phone] [place].  [address], [place], ACCESS LINE: 7823923662 or [phone], EntrepreneurLoan.[email or web address]  Dental Assistance  If unable to pay or uninsured, contact:  [facility] or [facility]. to become qualified for the adult dental clinic.  Patients with Medicaid: [facility] [phone], [phone] [address], [phone]  If unable to pay, or uninsured, contact [facility] [phone]) [phone]) or [facility] Department [phone] in [place], [phone] in [facility]) to become qualified for the adult dental clinic   Other Low-Cost Community Dental Services: - [facility]- [address] [place], [place], [place], [address], [phone], Ext. [phone], 2nd and 4th Thursday of the month at 6:30am.  10 clients each day by appointment, can sometimes see walk-in patients if someone does not show for an appointment. [facility]- [address] [place], [place], [address], [phone] - [facility]- [address], [place], [place], [address], [phone] - [facility]- [phone] [facility] Health Department- [phone] [facility] Department- [phone]

## 2016-05-22 NOTE — ED Provider Notes (Signed)
CSN: [id number]     Arrival date & time 05/22/16  1204 History  By signing my name below, I, Carrie Carlson, attest that this documentation has been prepared under the direction and in the presence of Shawn Joy, PA-C. Electronically Signed: Octavia HeirArianna Carlson, ED Scribe. 05/22/2016. 1:55 PM.    Chief Complaint  Patient presents with  . Knee Pain     (Consider location/radiation/quality/duration/timing/severity/associated sxs/prior Treatment) The history is provided by the patient. No language interpreter was used.   HPI Comments: Carrie Carlson is a 45 y.o. female with PMHx of osteoarthritis who presents to the Emergency Department via ambulance complaining of intermittent, moderate, chronic left knee pain that began to worsen one day ago. Pt states that she fractured her left knee in the distant past and developed osteoarthritis. Pt states that she has not seen a PCP or orthopedic surgeon due to her stay [facility]. Pt states she has taken extra strength Tylenol and IcyHot to alleviate her pain with no relief. Pt states she is out of her vicodin. Pt denies hx of kidney problems.  Past Medical History  Diagnosis Date  . Osteoarthritis   . Back pain   . Anxiety   . Diabetes mellitus without complication (HCC)    History reviewed. No pertinent past surgical history. No family history on file. Social History  Substance Use Topics  . Smoking status: Current Every Day Smoker  . Smokeless tobacco: None  . Alcohol Use: No   OB History    No data available     Review of Systems  Constitutional: Negative for fever and chills.  Musculoskeletal: Positive for arthralgias (left knee). Negative for joint swelling.  Neurological: Negative for weakness and numbness.      Allergies  Rocephin  Home Medications   Prior to Admission medications   Medication Sig Start Date End Date Taking? Authorizing Provider  acetaminophen (TYLENOL) 325 MG tablet Take 650 mg by mouth  every 6 (six) hours as needed for moderate pain.    Historical Provider, MD  albuterol (PROVENTIL HFA;VENTOLIN HFA) 108 (90 Base) MCG/ACT inhaler Inhale 2 puffs into the lungs every 4 (four) hours as needed for wheezing or shortness of breath.     Historical Provider, MD  atorvastatin (LIPITOR) 80 MG tablet Take 80 mg by mouth at bedtime.    Historical Provider, MD  benztropine (COGENTIN) 1 MG tablet Take 1 mg by mouth 2 (two) times daily.    Historical Provider, MD  cholecalciferol (VITAMIN D) 1000 UNITS tablet Take 1,000 Units by mouth daily.    Historical Provider, MD  escitalopram (LEXAPRO) 10 MG tablet Take 10 mg by mouth daily.    Historical Provider, MD  fluPHENAZine (PROLIXIN) 5 MG/ML solution Take 5 mg by mouth daily at 12 noon. At noon.    Historical Provider, MD  gabapentin (NEURONTIN) 300 MG capsule Take 300 mg by mouth 3 (three) times daily.    Historical Provider, MD  glipiZIDE (GLUCOTROL) 10 MG tablet Take 10 mg by mouth daily before breakfast.    Historical Provider, MD  HYDROcodone-acetaminophen (NORCO/VICODIN) 5-325 MG tablet Take 2 tablets by mouth every 4 (four) hours as needed. 04/28/16   Barrett HenleNicole Elizabeth Nadeau, PA-C  levothyroxine (SYNTHROID, LEVOTHROID) 25 MCG tablet Take 25 mcg by mouth daily before breakfast.    Historical Provider, MD  Melatonin 1 MG TABS Take 1 tablet by mouth at bedtime.    Historical Provider, MD  metFORMIN (GLUCOPHAGE) 1000 MG tablet Take 1,000 mg  by mouth 2 (two) times daily with a meal.     Historical Provider, MD  mirtazapine (REMERON) 30 MG tablet Take 30 mg by mouth at bedtime.    Historical Provider, MD  naproxen (NAPROSYN) 375 MG tablet Take 1 tablet (375 mg total) by mouth 2 (two) times daily with a meal. 04/20/16   Audry Pili, PA-C  naproxen (NAPROSYN) 500 MG tablet Take 1 tablet (500 mg total) by mouth 2 (two) times daily. 05/22/16   Shawn C Joy, PA-C  ondansetron (ZOFRAN) 4 MG tablet Take 4 mg by mouth every 6 (six) hours as needed for nausea or  vomiting.    Historical Provider, MD  pantoprazole (PROTONIX) 40 MG tablet Take 40 mg by mouth 2 (two) times daily.    Historical Provider, MD  perphenazine (TRILAFON) 8 MG tablet Take 8 mg by mouth 2 (two) times daily.    Historical Provider, MD  tiotropium (SPIRIVA) 18 MCG inhalation capsule Place 18 mcg into inhaler and inhale daily.    Historical Provider, MD   Triage Vitals: BP 119/85 mmHg  Pulse 100  Temp(Src) 97.7 F (36.5 C) (Oral)  Resp 18  SpO2 99% Physical Exam  Constitutional: She appears well-developed and well-nourished. No distress.  HENT:  Head: Normocephalic and atraumatic.  Eyes: Conjunctivae are normal.  Neck: Neck supple.  Cardiovascular: Normal rate, regular rhythm and intact distal pulses.   Pulmonary/Chest: Effort normal.  Musculoskeletal:  FROM left knee. Weightbearing intact. Motor and circulation intact distally. No evidence of swelling, effusion, or laxity. No increased warmth.  Neurological: She is alert. She has normal reflexes.  Strength 5/5. No sensory deficits.  Skin: Skin is warm and dry. She is not diaphoretic.  Nursing note and vitals reviewed.   ED Course  Procedures  DIAGNOSTIC STUDIES: Oxygen Saturation is 99% on RA, normal by my interpretation.  COORDINATION OF CARE: 1:55 PM Discussed treatment plan which includes Neproxin with pt at bedside and pt agreed to plan. Pt was advised to see an orthopedic surgeon and the pt agreed to plan.     MDM   Final diagnoses:  Knee pain, chronic, left    Carrie Carlson presents with chronic left knee pain.  This is chronic pain and the patient makes it clear that the reason she is here is because she is out of her pain medication. Patient was advised we would be unable to prescribe her narcotics for this chronic pain. Patient given resources for orthopedic follow-up and to establish care with PCP. Patient voiced improvement with conservative measures here in the ED. Home care and return  precautions discussed. Patient voiced understanding of these instructions, accepts the plan, and is comfortable with discharge.  I personally performed the services described in this documentation, which was scribed in my presence. The recorded information has been reviewed and is accurate.  Filed Vitals:   05/22/16 1208  BP: 119/85  Pulse: 100  Temp: 97.7 F (36.5 C)  TempSrc: Oral  Resp: 18  SpO2: 99%     Anselm Pancoast, PA-C 05/22/16 1616  Lorre Nick, MD 05/22/16 1714

## 2016-05-22 NOTE — ED Notes (Addendum)
Patient here with right knee pain x 1 day, denies injury. Thinks related to her arthritis, out of vicodin

## 2016-06-04 ENCOUNTER — Emergency Department (HOSPITAL_COMMUNITY)
Admission: EM | Admit: 2016-06-04 | Discharge: 2016-06-04 | Disposition: A | Discharge status: Home / Self Care | Payer: Medicaid Other | Attending: Emergency Medicine | Admitting: Emergency Medicine

## 2016-06-04 ENCOUNTER — Encounter (HOSPITAL_COMMUNITY): Payer: Self-pay | Admitting: Emergency Medicine

## 2016-06-04 DIAGNOSIS — F17200 Nicotine dependence, unspecified, uncomplicated: Secondary | ICD-10-CM | POA: Insufficient documentation

## 2016-06-04 DIAGNOSIS — Z79899 Other long term (current) drug therapy: Secondary | ICD-10-CM | POA: Diagnosis not present

## 2016-06-04 DIAGNOSIS — M1990 Unspecified osteoarthritis, unspecified site: Secondary | ICD-10-CM | POA: Insufficient documentation

## 2016-06-04 DIAGNOSIS — M25562 Pain in left knee: Secondary | ICD-10-CM | POA: Insufficient documentation

## 2016-06-04 DIAGNOSIS — Z7984 Long term (current) use of oral hypoglycemic drugs: Secondary | ICD-10-CM | POA: Insufficient documentation

## 2016-06-04 DIAGNOSIS — G478 Other sleep disorders: Secondary | ICD-10-CM | POA: Insufficient documentation

## 2016-06-04 DIAGNOSIS — F419 Anxiety disorder, unspecified: Secondary | ICD-10-CM | POA: Diagnosis not present

## 2016-06-04 DIAGNOSIS — E669 Obesity, unspecified: Secondary | ICD-10-CM | POA: Diagnosis not present

## 2016-06-04 DIAGNOSIS — Z760 Encounter for issue of repeat prescription: Secondary | ICD-10-CM | POA: Insufficient documentation

## 2016-06-04 DIAGNOSIS — G8929 Other chronic pain: Secondary | ICD-10-CM | POA: Diagnosis not present

## 2016-06-04 DIAGNOSIS — E119 Type 2 diabetes mellitus without complications: Secondary | ICD-10-CM | POA: Diagnosis not present

## 2016-06-04 LAB — CBG MONITORING, ED: Glucose-Capillary: 253 mg/dL — ABNORMAL HIGH (ref 65–99)

## 2016-06-04 MED ORDER — OXYCODONE-ACETAMINOPHEN 5-325 MG PO TABS
1.0000 | ORAL_TABLET | ORAL | Status: AC | PRN
  Administered 2016-06-04 (×2): 1 via ORAL
  Filled 2016-06-04: qty 1

## 2016-06-04 MED ORDER — LORAZEPAM 0.5 MG PO TABS: 1.0000 mg | ORAL_TABLET | Freq: Two times a day (BID) | ORAL | Status: AC

## 2016-06-04 MED ORDER — KETOROLAC TROMETHAMINE 30 MG/ML IJ SOLN
30.0000 mg | Freq: Once | INTRAMUSCULAR | Status: AC
  Administered 2016-06-04: 30 mg via INTRAMUSCULAR
  Filled 2016-06-04: qty 1

## 2016-06-04 MED ORDER — NAPROXEN 500 MG PO TABS: 500.0000 mg | ORAL_TABLET | Freq: Two times a day (BID) | ORAL | Status: AC

## 2016-06-04 MED ORDER — OXYCODONE-ACETAMINOPHEN 5-325 MG PO TABS
ORAL_TABLET | ORAL | Status: AC
  Administered 2016-06-04: 1 via ORAL
  Filled 2016-06-04: qty 1

## 2016-06-04 NOTE — Progress Notes (Signed)
CM met patient concerning needing follow up care. [facility] [place]. Appt scheduled at the [facility]- [facility] Clinic at the [facility] on Thurs 6/8 at [place].

## 2016-06-04 NOTE — Discharge Instructions (Signed)
An appointment has been made for you for follow-up on Thursday.  Please make sure to keep this appointment so that you can establish primary care   Chronic Pain Chronic pain can be defined as pain that is off and on and lasts for 3-6 months or longer. Many things cause chronic pain, which can make it difficult to make a diagnosis. There are many treatment options available for chronic pain. However, finding a treatment that works well for you may require trying various approaches until the right one is found. Many people benefit from a combination of two or more types of treatment to control their pain. SYMPTOMS  Chronic pain can occur anywhere in the body and can range from mild to very severe. Some types of chronic pain include:  Headache.  Low back pain.  Cancer pain.  Arthritis pain.  Neurogenic pain. This is pain resulting from damage to nerves. People with chronic pain may also have other symptoms such as:  Depression.  Anger.  Insomnia.  Anxiety. DIAGNOSIS  Your health care provider will help diagnose your condition over time. In many cases, the initial focus will be on excluding possible conditions that could be causing the pain. Depending on your symptoms, your health care provider may order tests to diagnose your condition. Some of these tests may include:   Blood tests.   CT scan.   MRI.   X-rays.   Ultrasounds.   Nerve conduction studies.  You may need to see a specialist.  TREATMENT  Finding treatment that works well may take time. You may be referred to a pain specialist. He or she may prescribe medicine or therapies, such as:   Mindful meditation or yoga.  Shots (injections) of numbing or pain-relieving medicines into the spine or area of pain.  Local electrical stimulation.  Acupuncture.   Massage therapy.   Aroma, color, light, or sound therapy.   Biofeedback.   Working with a physical therapist to keep from getting stiff.    Regular, gentle exercise.   Cognitive or behavioral therapy.   Group support.  Sometimes, surgery may be recommended.  HOME CARE INSTRUCTIONS   Take all medicines as directed by your health care provider.   Lessen stress in your life by relaxing and doing things such as listening to calming music.   Exercise or be active as directed by your health care provider.   Eat a healthy diet and include things such as vegetables, fruits, fish, and lean meats in your diet.   Keep all follow-up appointments with your health care provider.   Attend a support group with others suffering from chronic pain. SEEK MEDICAL CARE IF:   Your pain gets worse.   You develop a new pain that was not there before.   You cannot tolerate medicines given to you by your health care provider.   You have new symptoms since your last visit with your health care provider.  SEEK IMMEDIATE MEDICAL CARE IF:   You feel weak.   You have decreased sensation or numbness.   You lose control of bowel or bladder function.   Your pain suddenly gets much worse.   You develop shaking.  You develop chills.  You develop confusion.  You develop chest pain.  You develop shortness of breath.  MAKE SURE YOU:  Understand these instructions.  Will watch your condition.  Will get help right away if you are not doing well or get worse.   This information is not intended to  replace advice given to you by your health care provider. Make sure you discuss any questions you have with your health care provider.   Document Released: 09/08/2002 Document Revised: 08/19/2013 Document Reviewed: 06/12/2013 Elsevier Interactive Patient Education [employer]

## 2016-06-04 NOTE — ED Provider Notes (Signed)
CSN: [id number]     Arrival date & time 06/04/16  1744 History   First MD Initiated Contact with Patient 06/04/16 2000     Chief Complaint  Patient presents with  . Anxiety  . Knee Pain  . Arthritis  . Medication Refill     (Consider location/radiation/quality/duration/timing/severity/associated sxs/prior Treatment) HPI Comments: This is a 45 year old female with a history of arthritis, chronic knee pain.  Schizoaffective disorder, anxiety, obesity, diabetes type 2, chronic back pain who recently moved from [facility] where she was followed by a pain clinic in a PCP.  She doesn't make arrangements prior to her move to be followed locally.  She states she doesn't have the money to get back to [facility] to see her primary care physician or her pain clinic and she is out of her medicines.  She was seen here May 23.  The same thing.  She was given referrals.  She has not followed up, nor made appointment  Patient is a 45 y.o. female presenting with anxiety, knee pain, and arthritis. The history is provided by the patient.  Anxiety This is a chronic problem. The current episode started more than 1 year ago. The problem occurs constantly. The problem has been gradually worsening. Associated symptoms include myalgias. Pertinent negatives include no chest pain, fever or joint swelling. Nothing aggravates the symptoms. She has tried nothing for the symptoms. The treatment provided no relief.  Knee Pain Location:  Knee Injury: no   Knee location:  L knee Pain details:    Quality:  Aching   Radiates to:  Does not radiate   Severity:  Moderate   Onset quality:  Gradual   Timing:  Constant   Progression:  Worsening Chronicity:  Chronic Dislocation: no   Prior injury to area:  Unable to specify Relieved by:  Nothing Worsened by:  Activity Associated symptoms: no fever   Arthritis This is a chronic problem. The problem occurs constantly. The problem has been gradually worsening. Associated  symptoms include myalgias. Pertinent negatives include no chest pain, fever or joint swelling.    Past Medical History  Diagnosis Date  . Osteoarthritis   . Back pain   . Anxiety   . Diabetes mellitus without complication (HCC)    History reviewed. No pertinent past surgical history. No family history on file. Social History  Substance Use Topics  . Smoking status: Current Every Day Smoker  . Smokeless tobacco: None  . Alcohol Use: No   OB History    No data available     Review of Systems  Constitutional: Negative for fever.  Respiratory: Negative for shortness of breath.   Cardiovascular: Negative for chest pain and leg swelling.  Musculoskeletal: Positive for myalgias and arthritis. Negative for joint swelling.  Psychiatric/Behavioral: Positive for sleep disturbance. The patient is nervous/anxious.   All other systems reviewed and are negative.     Allergies  Rocephin  Home Medications   Prior to Admission medications   Medication Sig Start Date End Date Taking? Authorizing Provider  acetaminophen (TYLENOL) 325 MG tablet Take 650 mg by mouth every 6 (six) hours as needed for moderate pain.   Yes Historical Provider, MD  albuterol (PROVENTIL HFA;VENTOLIN HFA) 108 (90 Base) MCG/ACT inhaler Inhale 2 puffs into the lungs every 4 (four) hours as needed for wheezing or shortness of breath.    Yes Historical Provider, MD  atorvastatin (LIPITOR) 80 MG tablet Take 80 mg by mouth at bedtime.   Yes Historical Provider, MD  benztropine (COGENTIN) 1 MG tablet Take 1 mg by mouth 2 (two) times daily.   Yes Historical Provider, MD  cholecalciferol (VITAMIN D) 1000 UNITS tablet Take 1,000 Units by mouth daily.   Yes Historical Provider, MD  escitalopram (LEXAPRO) 10 MG tablet Take 10 mg by mouth daily.   Yes Historical Provider, MD  gabapentin (NEURONTIN) 300 MG capsule Take 300 mg by mouth 3 (three) times daily.   Yes Historical Provider, MD  glipiZIDE (GLUCOTROL) 10 MG tablet Take  10 mg by mouth daily before breakfast.   Yes Historical Provider, MD  levothyroxine (SYNTHROID, LEVOTHROID) 25 MCG tablet Take 25 mcg by mouth daily before breakfast.   Yes Historical Provider, MD  Melatonin 1 MG TABS Take 1 tablet by mouth at bedtime.   Yes Historical Provider, MD  Menthol, Topical Analgesic, (ICY HOT EX) Apply 1 application topically daily as needed. Apply to the back of the legs for pain per patient   Yes Historical Provider, MD  metFORMIN (GLUCOPHAGE) 1000 MG tablet Take 1,000 mg by mouth 2 (two) times daily with a meal.    Yes Historical Provider, MD  mirtazapine (REMERON) 30 MG tablet Take 30 mg by mouth at bedtime.   Yes Historical Provider, MD  ondansetron (ZOFRAN) 4 MG tablet Take 4 mg by mouth every 6 (six) hours as needed for nausea or vomiting.   Yes Historical Provider, MD  pantoprazole (PROTONIX) 40 MG tablet Take 40 mg by mouth 2 (two) times daily.   Yes Historical Provider, MD  perphenazine (TRILAFON) 8 MG tablet Take 8 mg by mouth 2 (two) times daily.   Yes Historical Provider, MD  tiotropium (SPIRIVA) 18 MCG inhalation capsule Place 18 mcg into inhaler and inhale daily.   Yes Historical Provider, MD  fluPHENAZine (PROLIXIN) 5 MG/ML solution Take 5 mg by mouth daily at 12 noon. Reported on 06/04/2016    Historical Provider, MD  HYDROcodone-acetaminophen (NORCO/VICODIN) 5-325 MG tablet Take 2 tablets by mouth every 4 (four) hours as needed. Patient not taking: Reported on 06/04/2016 04/28/16   Barrett Henle, PA-C  LORazepam (ATIVAN) 0.5 MG tablet Take 2 tablets (1 mg total) by mouth 2 (two) times daily. 06/04/16   Earley Favor, NP  naproxen (NAPROSYN) 500 MG tablet Take 1 tablet (500 mg total) by mouth 2 (two) times daily. 06/04/16   Earley Favor, NP   BP 153/93 mmHg  Pulse 104  Resp 20  Ht 5\' 7"  (1.702 m)  Wt 77.111 kg  BMI 26.62 kg/m2  SpO2 98% Physical Exam  Constitutional: She appears well-developed and well-nourished.  HENT:  Head: Normocephalic.  Eyes:  Pupils are equal, round, and reactive to light.  Neck: Normal range of motion.  Cardiovascular: Normal rate.   Pulmonary/Chest: Effort normal.  Musculoskeletal: Normal range of motion. She exhibits tenderness. She exhibits no edema.       Left knee: She exhibits normal range of motion, no swelling, no ecchymosis, no deformity and no erythema. Tenderness found.  Neurological: She is alert.  Skin: Skin is warm. No erythema.  Nursing note and vitals reviewed.   ED Course  Procedures (including critical care time) Labs Review Labs Reviewed  CBG MONITORING, ED - Abnormal; Notable for the following:    Glucose-Capillary 253 (*)    All other components within normal limits    Imaging Review No results found. I have personally reviewed and evaluated these images and lab results as part of my medical decision-making.   EKG Interpretation None  Will get case management involved Patient was again seen by case management, an appointment has been made for her for follow-up with community wellness on Thursday June 8  MDM   Final diagnoses:  Anxiety  Knee pain, chronic, left         Earley FavorGail Tawney Vanorman, NP 06/04/16 2125  Pricilla LovelessScott Goldston, MD 06/05/16 [phone]

## 2016-06-04 NOTE — ED Notes (Signed)
Arrived via EMS for medication refill for arthritis, sleep, and anxiety.  Patient states pain left knee 8/10 achy and has not slept for 2 weeks and finally slept last night. States nervous and scared not sleeping and out of her regular medications.

## 2016-06-04 NOTE — ED Notes (Signed)
Pt stable, ambulatory, states understanding of discharge instructions 

## 2016-06-05 ENCOUNTER — Telehealth: Payer: Self-pay | Admitting: Surgery

## 2016-06-05 NOTE — Telephone Encounter (Signed)
ED CM attempted to contact patient to inform her of change of appt date and time. CM unable to reach patient phone number not listed in record.

## 2016-06-06 ENCOUNTER — Inpatient Hospital Stay: Payer: Self-pay | Admitting: Internal Medicine

## 2016-06-07 ENCOUNTER — Emergency Department (HOSPITAL_COMMUNITY)
Admission: EM | Admit: 2016-06-07 | Discharge: 2016-06-07 | Disposition: A | Discharge status: Home / Self Care | Payer: Medicaid Other | Attending: Dermatology | Admitting: Dermatology

## 2016-06-07 ENCOUNTER — Encounter (HOSPITAL_COMMUNITY): Payer: Self-pay | Admitting: Family Medicine

## 2016-06-07 DIAGNOSIS — Z5321 Procedure and treatment not carried out due to patient leaving prior to being seen by health care provider: Secondary | ICD-10-CM | POA: Insufficient documentation

## 2016-06-07 DIAGNOSIS — M549 Dorsalgia, unspecified: Secondary | ICD-10-CM | POA: Insufficient documentation

## 2016-06-07 DIAGNOSIS — R531 Weakness: Secondary | ICD-10-CM | POA: Diagnosis not present

## 2016-06-07 NOTE — ED Notes (Signed)
Pt here for thoracic back pain, sharp in nature. sts also some weakness. sts started last night. Denies chest pain, SOB. No N,V,D. CBG 281. EKG unremarkable.

## 2016-06-07 NOTE — ED Notes (Signed)
Called patient with no answer in lobby.

## 2017-03-30 IMAGING — CR DG CHEST 2V
2 series · 2 of 2 positions shown · non-contrast
Comparison: None.

CLINICAL DATA: Patient complaining of dry cough and shortness of
breath for 2 weeks.

EXAM:
CHEST  2 VIEW

[w chest pa]
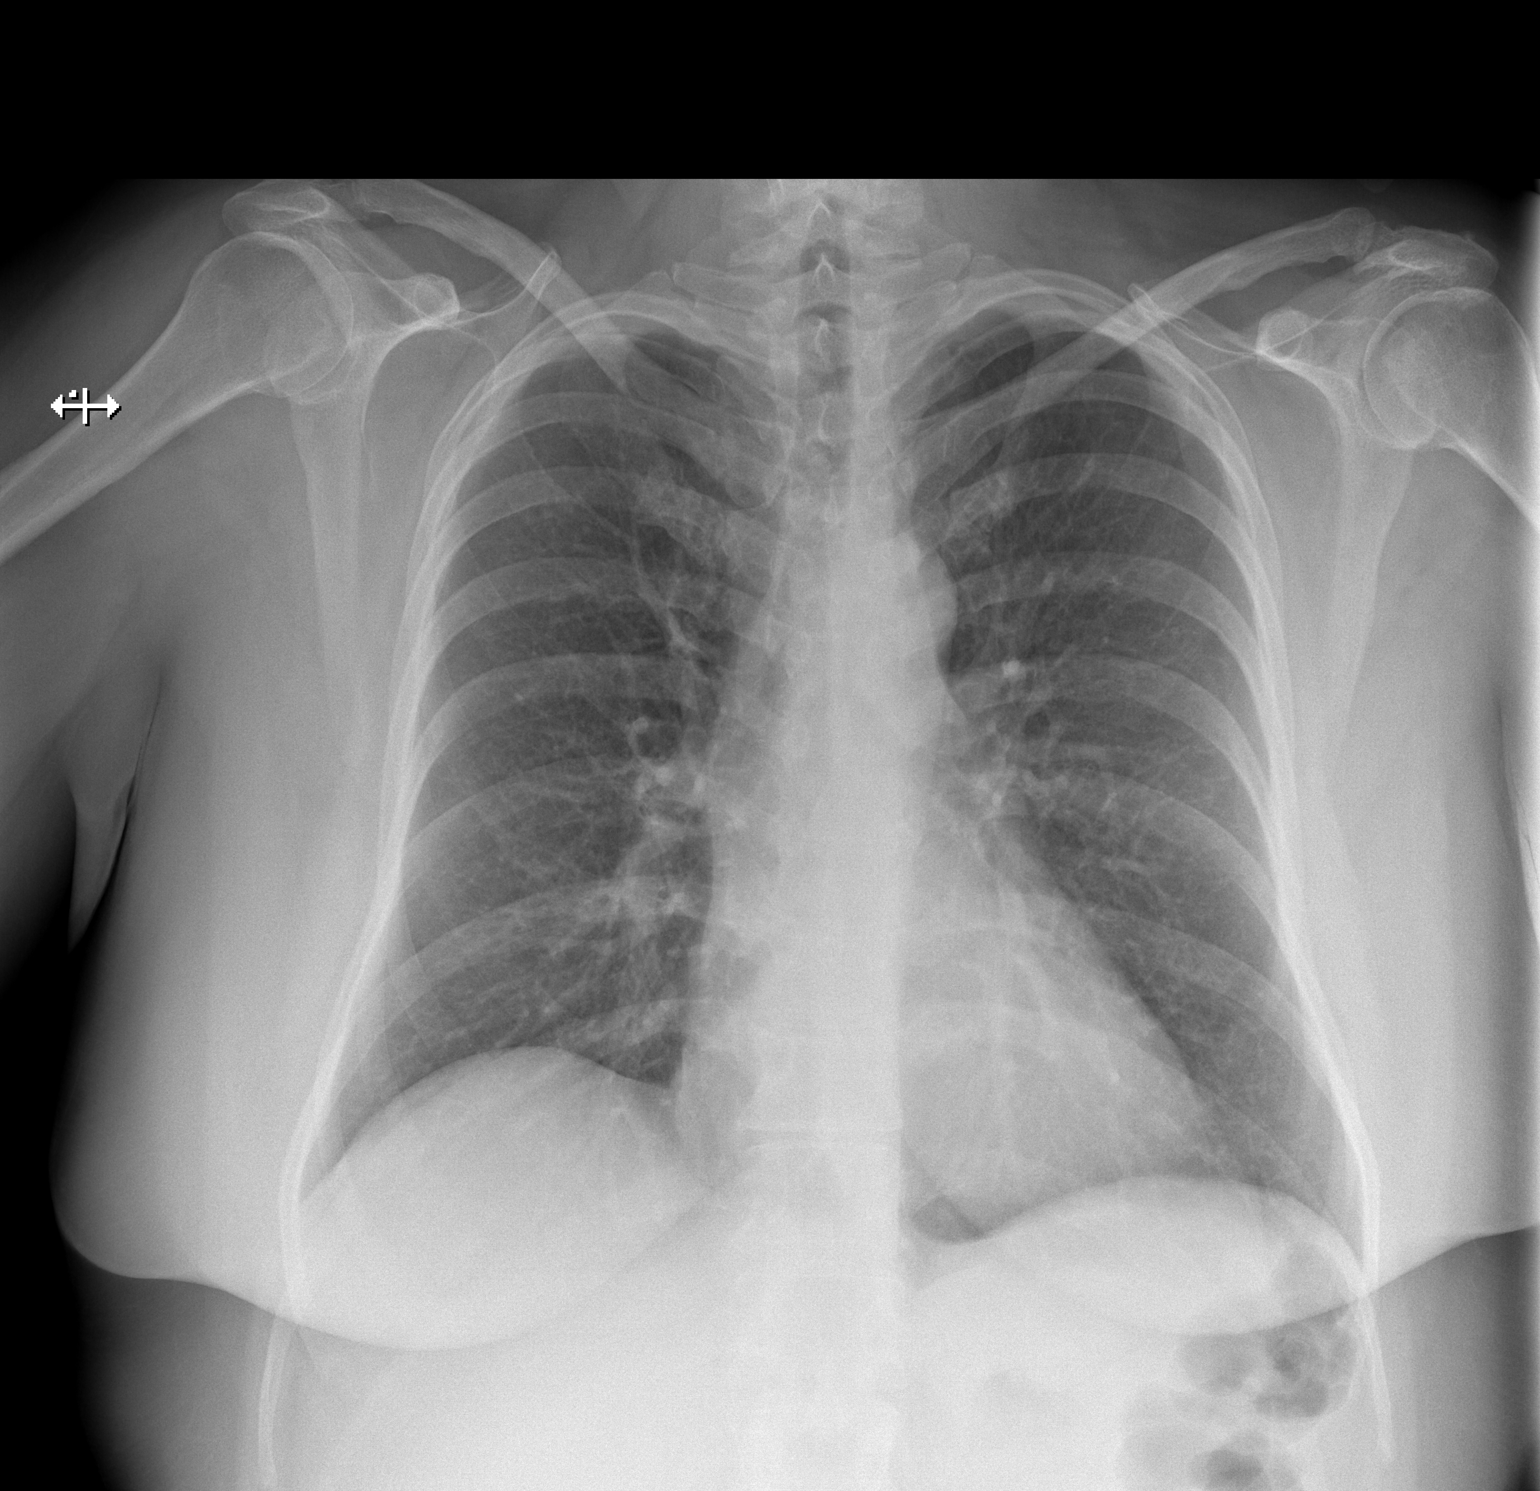

[w chest lat]
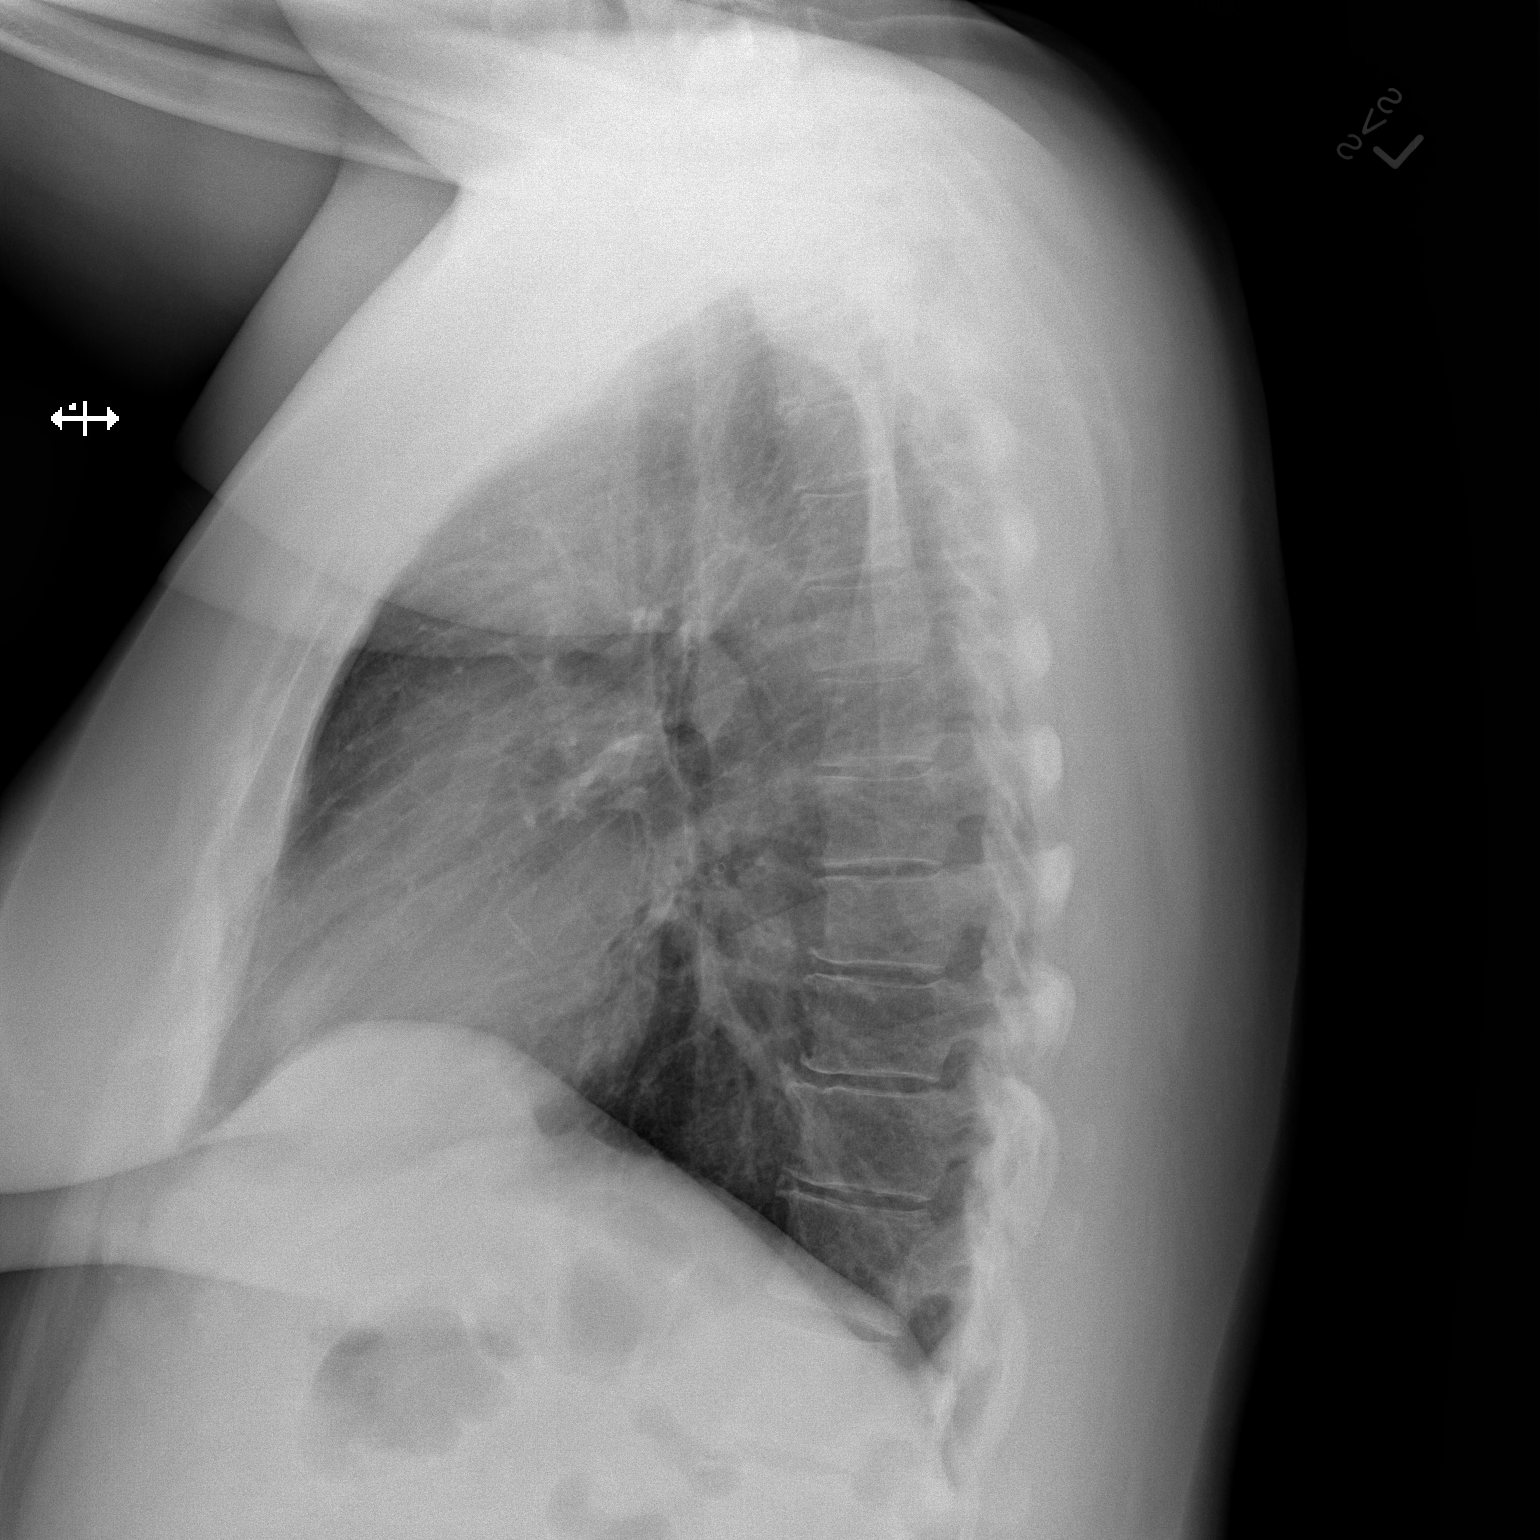

[2 of 2 positions shown; findings below may reference images not displayed]

FINDINGS: Normal heart, mediastinum and hila.

Lungs are clear.  No pleural effusion or pneumothorax.

Bony thorax is intact.
IMPRESSION: No active cardiopulmonary disease.

## 2018-03-26 IMAGING — DX DG KNEE COMPLETE 4+V*L*
4 series · 4 of 4 positions shown · non-contrast
Comparison: 01/04/2016

CLINICAL DATA: Left knee pain for 1 year.

EXAM:
LEFT KNEE - COMPLETE 4+ VIEW

[t knee ap left]
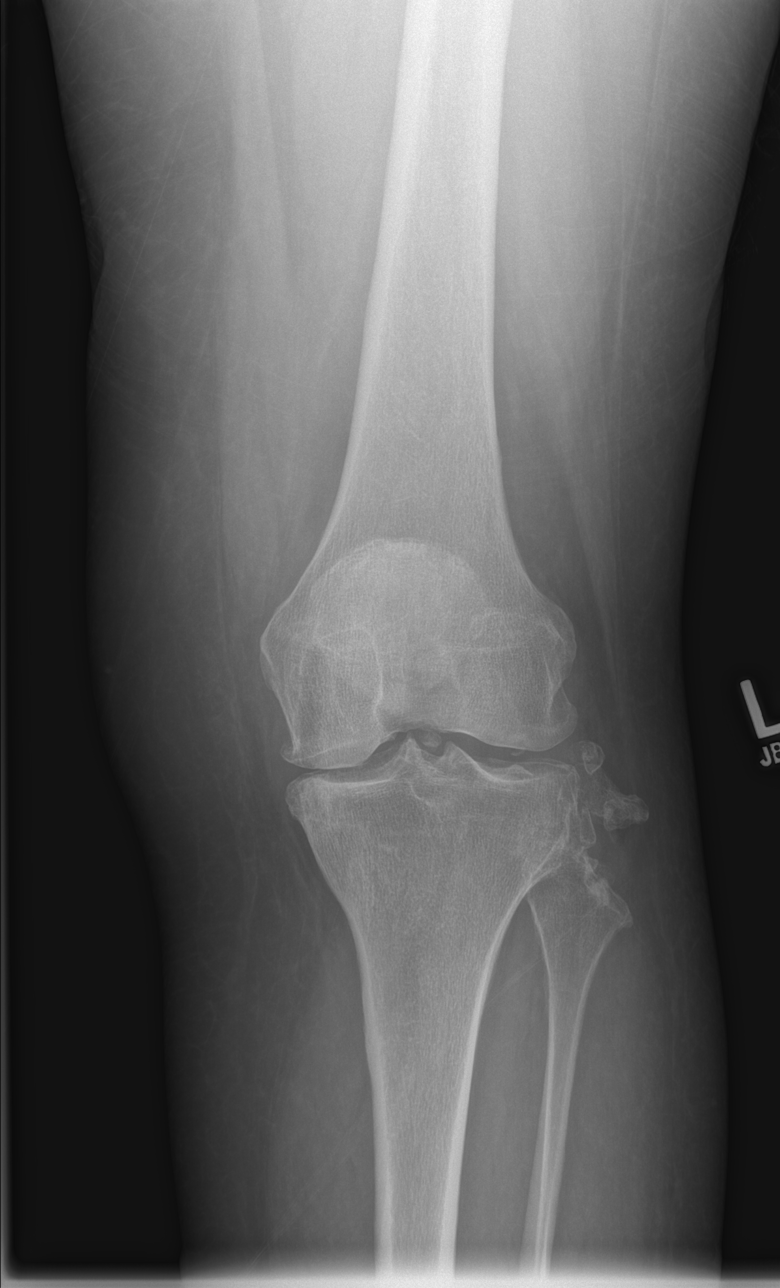

[t knee obl left (1 of 2)]
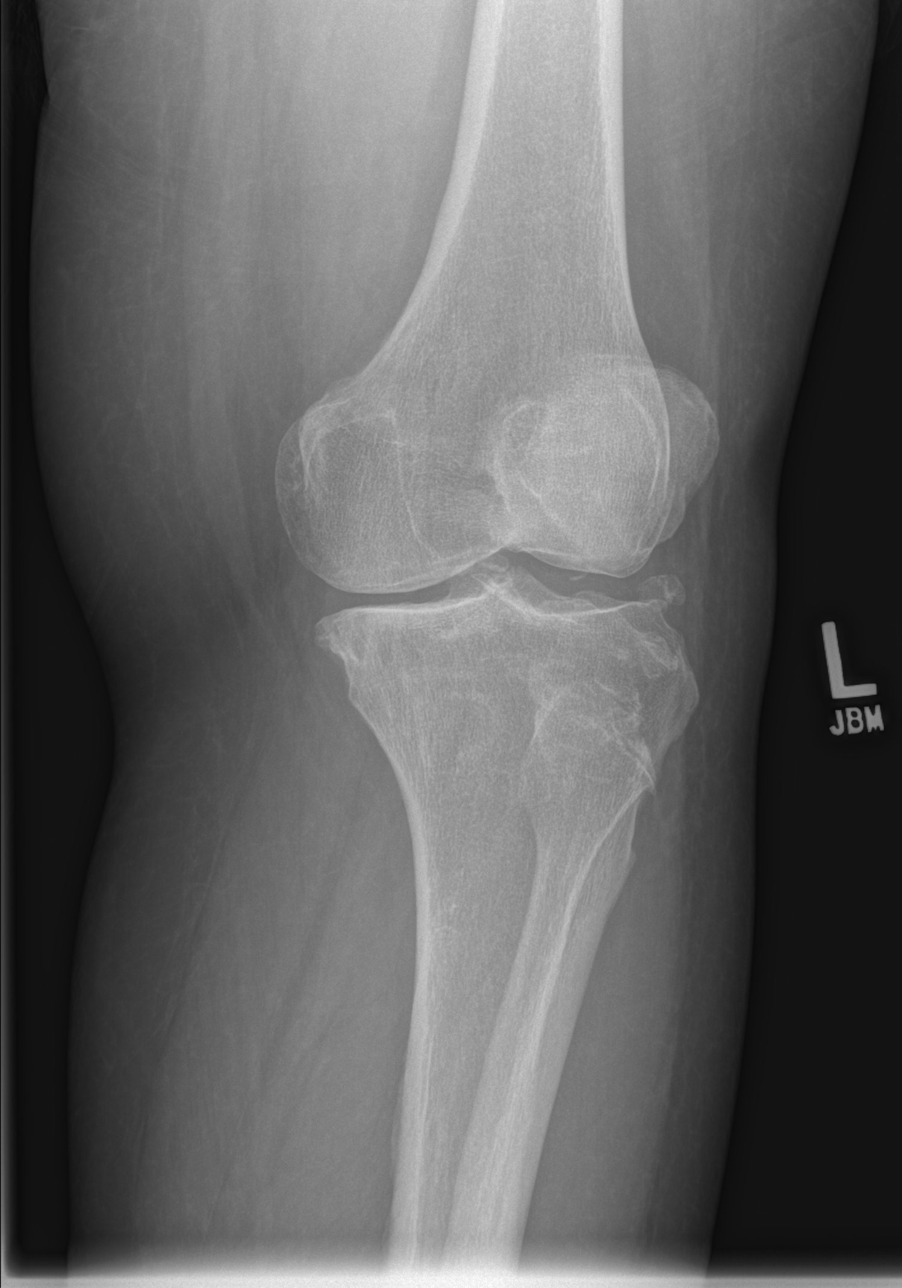

[t knee obl left (2 of 2)]
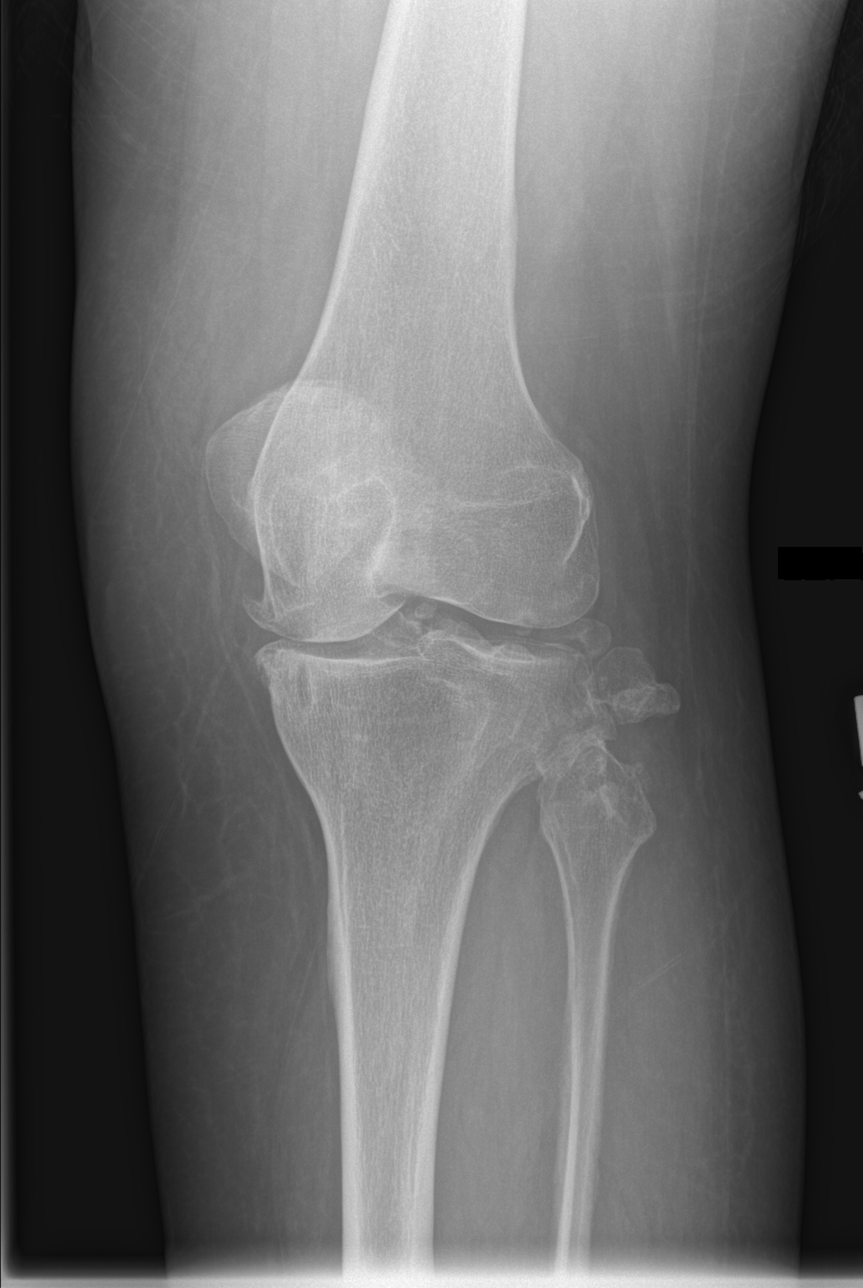

[t knee lat left]
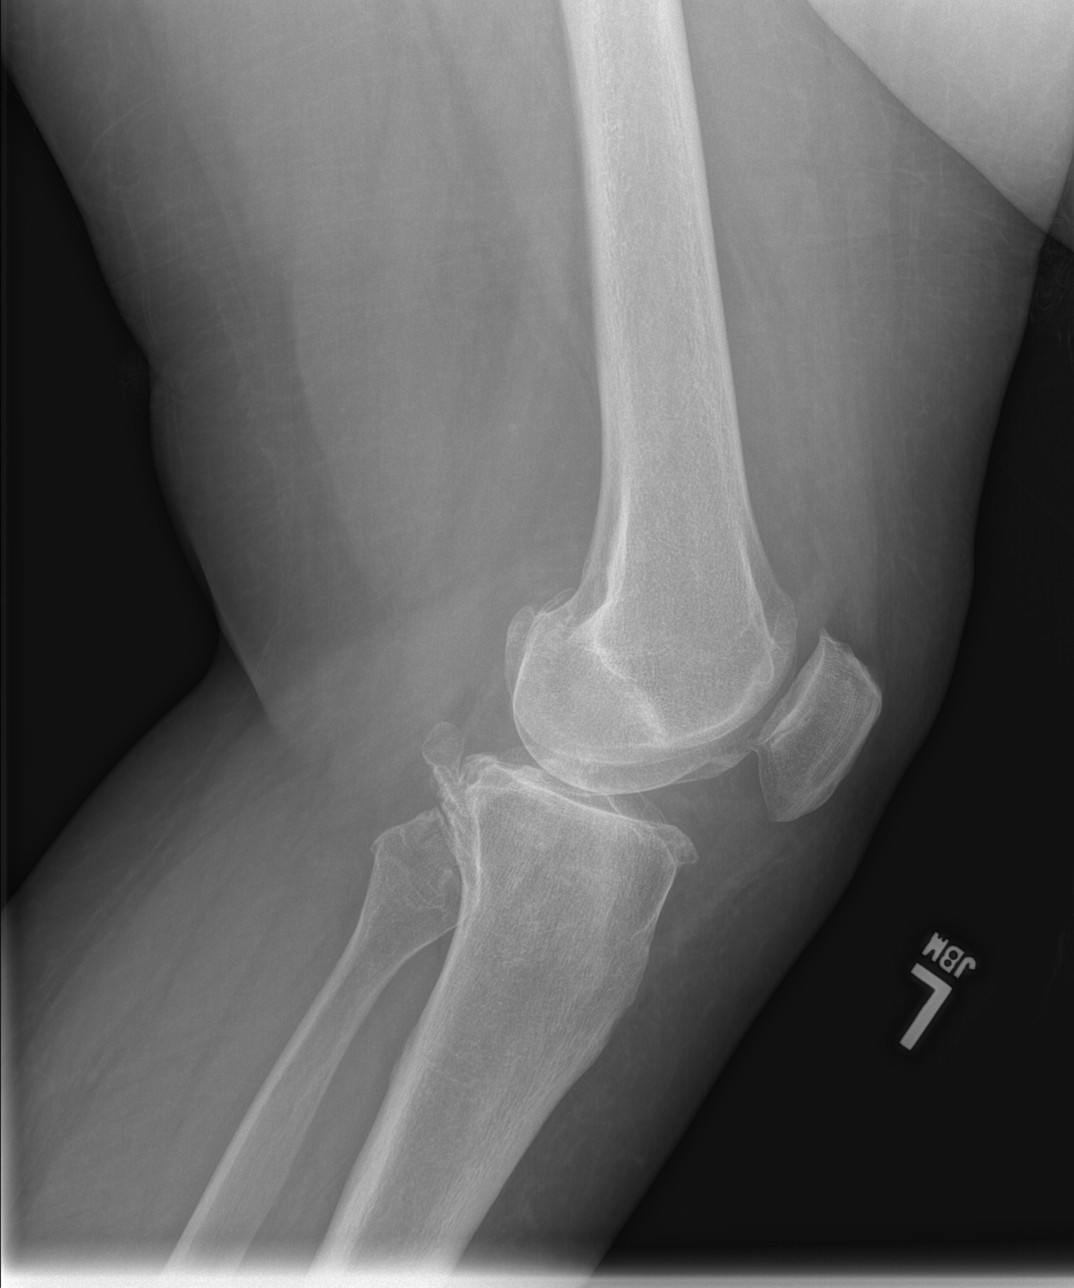

[4 of 4 positions shown; findings below may reference images not displayed]

FINDINGS: There is no evidence of fracture, dislocation, or joint effusion.
Tricompartmental osteoarthritis is seen. Heterotopic soft tissue
ossification is again seen along the lateral aspect of the proximal
tibia. A possible ossified joint body is again seen projecting
within intracondylar notch.
IMPRESSION: No acute findings. Stable tricompartmental osteoarthritis,
heterotopic soft tissue ossification, and possible small ossified
joint body.

## 2019-11-21 ENCOUNTER — Encounter: Payer: Self-pay | Admitting: Intensive Care

## 2019-11-21 ENCOUNTER — Emergency Department
Admission: EM | Admit: 2019-11-21 | Discharge: 2019-11-21 | Disposition: A | Discharge status: Home / Self Care | Payer: Medicaid Other | Attending: Student in an Organized Health Care Education/Training Program | Admitting: Student in an Organized Health Care Education/Training Program

## 2019-11-21 ENCOUNTER — Emergency Department: Payer: Medicaid Other

## 2019-11-21 ENCOUNTER — Other Ambulatory Visit: Payer: Self-pay

## 2019-11-21 DIAGNOSIS — Y929 Unspecified place or not applicable: Secondary | ICD-10-CM | POA: Insufficient documentation

## 2019-11-21 DIAGNOSIS — S5011XA Contusion of right forearm, initial encounter: Secondary | ICD-10-CM | POA: Diagnosis not present

## 2019-11-21 DIAGNOSIS — W01190A Fall on same level from slipping, tripping and stumbling with subsequent striking against furniture, initial encounter: Secondary | ICD-10-CM | POA: Diagnosis not present

## 2019-11-21 DIAGNOSIS — Y939 Activity, unspecified: Secondary | ICD-10-CM | POA: Diagnosis not present

## 2019-11-21 DIAGNOSIS — Y999 Unspecified external cause status: Secondary | ICD-10-CM | POA: Insufficient documentation

## 2019-11-21 DIAGNOSIS — Z79899 Other long term (current) drug therapy: Secondary | ICD-10-CM | POA: Insufficient documentation

## 2019-11-21 DIAGNOSIS — Z7984 Long term (current) use of oral hypoglycemic drugs: Secondary | ICD-10-CM | POA: Insufficient documentation

## 2019-11-21 DIAGNOSIS — F17210 Nicotine dependence, cigarettes, uncomplicated: Secondary | ICD-10-CM | POA: Diagnosis not present

## 2019-11-21 DIAGNOSIS — E119 Type 2 diabetes mellitus without complications: Secondary | ICD-10-CM | POA: Diagnosis not present

## 2019-11-21 DIAGNOSIS — S59911A Unspecified injury of right forearm, initial encounter: Secondary | ICD-10-CM | POA: Diagnosis present

## 2019-11-21 MED ORDER — IBUPROFEN 600 MG PO TABS
600.0000 mg | ORAL_TABLET | Freq: Once | ORAL | Status: AC
  Administered 2019-11-21: 600 mg via ORAL
  Filled 2019-11-21: qty 1

## 2019-11-21 MED ORDER — TRAMADOL HCL 50 MG PO TABS: 50.0000 mg | ORAL_TABLET | Freq: Four times a day (QID) | ORAL | 0 refills | Status: AC | PRN

## 2019-11-21 MED ORDER — TRAMADOL HCL 50 MG PO TABS
50.0000 mg | ORAL_TABLET | Freq: Once | ORAL | Status: AC
  Administered 2019-11-21: 50 mg via ORAL
  Filled 2019-11-21: qty 1

## 2019-11-21 MED ORDER — IBUPROFEN 600 MG PO TABS: 600.0000 mg | ORAL_TABLET | Freq: Three times a day (TID) | ORAL | 0 refills | Status: AC | PRN

## 2019-11-21 NOTE — ED Notes (Signed)
First Nurse Note: Pt to ED via [facility], [facility] c/o right arm pain. Pt is in NAD

## 2019-11-21 NOTE — Discharge Instructions (Addendum)
Wear Ace wrap and arm sling for 3 to 5 days as needed.  Take medication as directed.

## 2019-11-21 NOTE — ED Triage Notes (Signed)
Patient arrived by EMS from [facility] care home in [place] for right arm pain. Reports she tripped on Monday and hit her right arm on her nightstand.

## 2019-11-21 NOTE — ED Provider Notes (Signed)
[facility] Emergency Department Provider Note   ____________________________________________   First MD Initiated Contact with Patient 11/21/19 1313     (approximate)  I have reviewed the triage vital signs and the nursing notes.   HISTORY  Chief Complaint Arm Pain (right)    HPI Carrie Carlson is a 48 y.o. female patient complain of right forearm pain secondary to a mechanical fall striking her arm on the nightstand.  Incident occurred 5 days ago.  Patient the pain creased with pronation supination.  Patient denies loss of sensation.  Patient is right-hand dominant.  Patient rates pain as a 7/10.  Patient described the pain as "achy".  No palliative measure for complaint.         Past Medical History:  Diagnosis Date  . Anxiety   . Back pain   . Diabetes mellitus without complication (HCC)   . Osteoarthritis     Patient Active Problem List   Diagnosis Date Noted  . Aggressive behavior   . Schizoaffective disorder, bipolar type (HCC) 04/26/2015  . Laceration of right wrist     History reviewed. No pertinent surgical history.  Prior to Admission medications   Medication Sig Start Date End Date Taking? Authorizing Provider  acetaminophen (TYLENOL) 325 MG tablet Take 650 mg by mouth every 6 (six) hours as needed for moderate pain.    [provider]  albuterol (PROVENTIL HFA;VENTOLIN HFA) 108 (90 Base) MCG/ACT inhaler Inhale 2 puffs into the lungs every 4 (four) hours as needed for wheezing or shortness of breath.     [provider]  atorvastatin (LIPITOR) 80 MG tablet Take 80 mg by mouth at bedtime.    [provider]  benztropine (COGENTIN) 1 MG tablet Take 1 mg by mouth 2 (two) times daily.    [provider]  cholecalciferol (VITAMIN D) 1000 UNITS tablet Take 1,000 Units by mouth daily.    [provider]  escitalopram (LEXAPRO) 10 MG tablet Take 10 mg by mouth daily.    [provider]  fluPHENAZine (PROLIXIN) 5 MG/ML solution Take 5 mg by mouth daily at 12 noon. Reported on 06/04/2016    [provider]  gabapentin (NEURONTIN) 300 MG capsule Take 300 mg by mouth 3 (three) times daily.    [provider]  glipiZIDE (GLUCOTROL) 10 MG tablet Take 10 mg by mouth daily before breakfast.    [provider]  HYDROcodone-acetaminophen (NORCO/VICODIN) 5-325 MG tablet Take 2 tablets by mouth every 4 (four) hours as needed. Patient not taking: Reported on 06/04/2016 04/28/16   Barrett Henle, PA-C  ibuprofen (ADVIL) 600 MG tablet Take 1 tablet (600 mg total) by mouth every 8 (eight) hours as needed. 11/21/19   Joni Reining, PA-C  levothyroxine (SYNTHROID, LEVOTHROID) 25 MCG tablet Take 25 mcg by mouth daily before breakfast.    [provider]  LORazepam (ATIVAN) 0.5 MG tablet Take 2 tablets (1 mg total) by mouth 2 (two) times daily. 06/04/16   Earley Favor, NP  Melatonin 1 MG TABS Take 1 tablet by mouth at bedtime.    [provider]  Menthol, Topical Analgesic, (ICY HOT EX) Apply 1 application topically daily as needed. Apply to the back of the legs for pain per patient    [provider]  metFORMIN (GLUCOPHAGE) 1000 MG tablet Take 1,000 mg by mouth 2 (two) times daily with a meal.     [provider]  mirtazapine (REMERON) 30 MG tablet  Take 30 mg by mouth at bedtime.    [provider]  naproxen (NAPROSYN) 500 MG tablet Take 1 tablet (500 mg total) by mouth 2 (two) times daily. 06/04/16   Junius Creamer, NP  ondansetron (ZOFRAN) 4 MG tablet Take 4 mg by mouth every 6 (six) hours as needed for nausea or vomiting.    [provider]  pantoprazole (PROTONIX) 40 MG tablet Take 40 mg by mouth 2 (two) times daily.    [provider]  perphenazine (TRILAFON) 8 MG tablet Take 8 mg by mouth 2 (two) times daily.    [provider]  tiotropium (SPIRIVA) 18 MCG inhalation capsule Place  18 mcg into inhaler and inhale daily.    [provider]  traMADol (ULTRAM) 50 MG tablet Take 1 tablet (50 mg total) by mouth every 6 (six) hours as needed. 11/21/19 11/20/20  Sable Feil, PA-C    Allergies Rocephin [ceftriaxone] and Codeine  History reviewed. No pertinent family history.  Social History Social History   Tobacco Use  . Smoking status: Current Every Day Smoker    Types: Cigarettes  . Smokeless tobacco: Never Used  Substance Use Topics  . Alcohol use: Yes    Comment: rare  . Drug use: No    Review of Systems Constitutional: No fever/chills Eyes: No visual changes. ENT: No sore throat. Cardiovascular: Denies chest pain. Respiratory: Denies shortness of breath. Gastrointestinal: No abdominal pain.  No nausea, no vomiting.  No diarrhea.  No constipation. Genitourinary: Negative for dysuria. Musculoskeletal: Negative for back pain. Skin: Negative for rash. Neurological: Negative for headaches, focal weakness or numbness. Psychiatric:  Anxiety Endocrine:  Diabetes and hypothyroidism. Allergic/Immunilogical: Ceftin and codeine. ____________________________________________   PHYSICAL EXAM:  VITAL SIGNS: ED Triage Vitals [11/21/19 1248]  Enc Vitals Group     BP 128/76     Pulse Rate 94     Resp 16     Temp 98.2 F (36.8 C)     Temp Source Oral     SpO2 99 %     Weight 228 lb (103.4 kg)     Height 5' 6.5" (1.689 m)     Head Circumference      Peak Flow      Pain Score 7     Pain Loc      Pain Edu?      Excl. in [place]?    Constitutional: Alert and oriented. Well appearing and in no acute distress. Neck: No cervical spine tenderness to palpation. Hematological/Lymphatic/Immunilogical: No cervical lymphadenopathy. Cardiovascular: Normal rate, regular rhythm. Grossly normal heart sounds.  Good peripheral circulation. Respiratory: Normal respiratory effort.  No retractions. Lungs CTAB. Gastrointestinal: Soft and nontender. No distention. No  abdominal bruits. No CVA tenderness. Genitourinary: Deferred Musculoskeletal: No obvious deformity to the right forearm.  Patient is moderate guarding palpation the midshaft of the ulna and radius.  Patient was able to pronate and supinate through pain.   Neurologic:  Normal speech and language. No gross focal neurologic deficits are appreciated. No gait instability. Skin:  Skin is warm, dry and intact. No rash noted. Psychiatric: Mood and affect are normal. Speech and behavior are normal.  ____________________________________________   LABS (all labs ordered are listed, but only abnormal results are displayed)  Labs Reviewed - No data to display ____________________________________________  EKG   ____________________________________________  RADIOLOGY  ED MD interpretation:    Official radiology report(s): Dg Forearm Right  Result Date: 11/21/2019 CLINICAL DATA:  Pain. EXAM: RIGHT FOREARM -  2 VIEW COMPARISON:  None. FINDINGS: There is no evidence of fracture or other focal bone lesions. Soft tissues are unremarkable. IMPRESSION: Negative. Electronically Signed   By: Kennith CenterEric  Mansell M.D.   On: 11/21/2019 13:41    ____________________________________________   PROCEDURES  Procedure(s) performed (including Critical Care):  Procedures   ____________________________________________   INITIAL IMPRESSION / ASSESSMENT AND PLAN / ED COURSE  As part of my medical decision making, I reviewed the following data within the electronic MEDICAL RECORD NUMBER     Patient presents with 5 days of the right arm pain.  Discussed negative x-ray findings with patient.  Physical exam is consistent with contusion of the right forearm.  Patient arm was Ace wrapped and placed in a sling.  Patient given discharge care instruction advised take medication as directed.  Patient advised follow-up PCP.    Carrie Carlson was evaluated in Emergency Department on 11/21/2019 for the symptoms described  in the history of present illness. She was evaluated in the context of the global COVID-19 pandemic, which necessitated consideration that the patient might be at risk for infection with the SARS-CoV-2 virus that causes COVID-19. Institutional protocols and algorithms that pertain to the evaluation of patients at risk for COVID-19 are in a state of rapid change based on information released by regulatory bodies including the CDC and federal and state organizations. These policies and algorithms were followed during the patient's care in the ED.       ____________________________________________   FINAL CLINICAL IMPRESSION(S) / ED DIAGNOSES  Final diagnoses:  Contusion of right forearm, initial encounter     ED Discharge Orders         Ordered    traMADol (ULTRAM) 50 MG tablet  Every 6 hours PRN     11/21/19 1407    ibuprofen (ADVIL) 600 MG tablet  Every 8 hours PRN     11/21/19 1407           Note:  This document was prepared using Dragon voice recognition software and may include unintentional dictation errors.    Joni ReiningSmith, Ronald K, PA-C 11/21/19 1408    Willy Eddyobinson, Patrick, MD 11/21/19 [phone]

## 2020-06-28 ENCOUNTER — Telehealth: Payer: Medicaid Other
